# Patient Record
Sex: Female | Born: 1970 | Race: White | Hispanic: Yes | Marital: Married | Smoking: Never smoker
Health system: Southern US, Community
[De-identification: ages and names within clinical notes are randomized; demographics above are authoritative.]

---

## 1997-11-12 ENCOUNTER — Ambulatory Visit (HOSPITAL_COMMUNITY): Admission: RE | Admit: 1997-11-12 | Discharge: 1997-11-12 | Payer: Self-pay | Admitting: *Deleted

## 1998-03-29 ENCOUNTER — Inpatient Hospital Stay (HOSPITAL_COMMUNITY): Admission: AD | Admit: 1998-03-29 | Discharge: 1998-03-29 | Payer: Self-pay | Admitting: Obstetrics

## 1998-04-25 ENCOUNTER — Inpatient Hospital Stay (HOSPITAL_COMMUNITY): Admission: AD | Admit: 1998-04-25 | Discharge: 1998-04-27 | Payer: Self-pay | Admitting: Family Medicine

## 1998-04-25 ENCOUNTER — Inpatient Hospital Stay (HOSPITAL_COMMUNITY): Admission: AD | Admit: 1998-04-25 | Discharge: 1998-04-25 | Payer: Self-pay | Admitting: Obstetrics & Gynecology

## 1998-04-30 ENCOUNTER — Encounter (HOSPITAL_COMMUNITY): Admission: RE | Admit: 1998-04-30 | Discharge: 1998-07-29 | Payer: Self-pay | Admitting: Obstetrics & Gynecology

## 2002-04-10 ENCOUNTER — Encounter: Admission: RE | Admit: 2002-04-10 | Discharge: 2002-04-10 | Payer: Self-pay | Admitting: Family Medicine

## 2002-04-10 ENCOUNTER — Encounter: Payer: Self-pay | Admitting: Family Medicine

## 2005-10-03 ENCOUNTER — Inpatient Hospital Stay (HOSPITAL_COMMUNITY): Admission: AD | Admit: 2005-10-03 | Discharge: 2005-10-03 | Payer: Self-pay | Admitting: Obstetrics

## 2005-12-05 ENCOUNTER — Inpatient Hospital Stay (HOSPITAL_COMMUNITY): Admission: AD | Admit: 2005-12-05 | Discharge: 2005-12-07 | Payer: Self-pay | Admitting: Obstetrics

## 2006-06-02 ENCOUNTER — Emergency Department (HOSPITAL_COMMUNITY): Admission: EM | Admit: 2006-06-02 | Discharge: 2006-06-02 | Payer: Self-pay | Admitting: *Deleted

## 2010-07-11 ENCOUNTER — Inpatient Hospital Stay (HOSPITAL_COMMUNITY)
Admission: AD | Admit: 2010-07-11 | Discharge: 2010-07-11 | Disposition: A | Discharge status: Home / Self Care | Payer: Self-pay | Source: Ambulatory Visit | Attending: Obstetrics & Gynecology | Admitting: Obstetrics & Gynecology

## 2010-07-11 ENCOUNTER — Inpatient Hospital Stay (HOSPITAL_COMMUNITY): Payer: Self-pay

## 2010-07-11 DIAGNOSIS — R109 Unspecified abdominal pain: Secondary | ICD-10-CM

## 2010-07-11 DIAGNOSIS — O209 Hemorrhage in early pregnancy, unspecified: Secondary | ICD-10-CM | POA: Insufficient documentation

## 2010-07-11 LAB — URINALYSIS, ROUTINE W REFLEX MICROSCOPIC
Glucose, UA: NEGATIVE mg/dL
Leukocytes, UA: NEGATIVE
pH: 6.5 (ref 5.0–8.0)

## 2010-07-11 LAB — ABO/RH: ABO/RH(D): O POS

## 2010-07-11 LAB — CBC
MCH: 25 pg — ABNORMAL LOW (ref 26.0–34.0)
MCHC: 32.1 g/dL (ref 30.0–36.0)
Platelets: 283 10*3/uL (ref 150–400)
RDW: 16.7 % — ABNORMAL HIGH (ref 11.5–15.5)

## 2010-07-11 LAB — URINE MICROSCOPIC-ADD ON

## 2010-07-13 ENCOUNTER — Inpatient Hospital Stay (HOSPITAL_COMMUNITY)
Admission: AD | Admit: 2010-07-13 | Discharge: 2010-07-13 | Disposition: A | Discharge status: Home / Self Care | Payer: Self-pay | Source: Ambulatory Visit | Attending: Obstetrics & Gynecology | Admitting: Obstetrics & Gynecology

## 2010-07-13 DIAGNOSIS — O209 Hemorrhage in early pregnancy, unspecified: Secondary | ICD-10-CM | POA: Insufficient documentation

## 2010-07-13 LAB — HCG, QUANTITATIVE, PREGNANCY: hCG, Beta Chain, Quant, S: 7580 m[IU]/mL — ABNORMAL HIGH (ref <5)

## 2010-07-20 ENCOUNTER — Inpatient Hospital Stay (HOSPITAL_COMMUNITY): Admission: AD | Admit: 2010-07-20 | Payer: Self-pay | Source: Ambulatory Visit | Admitting: Obstetrics and Gynecology

## 2010-07-20 ENCOUNTER — Inpatient Hospital Stay (HOSPITAL_COMMUNITY)
Admission: AD | Admit: 2010-07-20 | Discharge: 2010-07-20 | Disposition: A | Discharge status: Home / Self Care | Payer: Self-pay | Source: Ambulatory Visit | Attending: Obstetrics and Gynecology | Admitting: Obstetrics and Gynecology

## 2010-07-20 ENCOUNTER — Encounter (HOSPITAL_COMMUNITY): Payer: Self-pay

## 2010-07-20 DIAGNOSIS — O039 Complete or unspecified spontaneous abortion without complication: Secondary | ICD-10-CM

## 2010-07-20 NOTE — Progress Notes (Deleted)
Pt presented to lobby c/o severe abd pain and cramping since this am. She gets prenatal care in [facility]. She states she has been in the hospital for preterm labor during this pregnancy. She is a G2P1 at [employer].  Cervix is completely dilated with bulging membranes.  Faculty practice notified and pt on her way to L&D.

## 2010-07-20 NOTE — Progress Notes (Signed)
Dr. Dimple Casey with pt in triage room discussing lab results. Pt voices understanding per Eda, Spanish interpreter.

## 2010-07-20 NOTE — Progress Notes (Signed)
To MAU for repeat BHCG

## 2010-07-20 NOTE — Progress Notes (Signed)
07/20/2010 Nickolaus Bordelon  Interpreter  I assisted Huntley Dec RN and Henrietta Hoover PA with plan of care.

## 2010-07-20 NOTE — ED Provider Notes (Signed)
History   Pt is a G3P2AB and presents today for repeat B-quant. She has a documented failed pregnancy and has been followed with expectant management. Her BHCG was 28,415 on 07/11/10 and 7,580 on 07/13/10. Pt states she is doing well and has no complaints at this time. She denies abd pain and reports only minimal vag spotting.  Chief Complaint  Patient presents with  . Follow-up   The history is provided by the patient. The history is limited by a language barrier. A language interpreter was used.      No past medical history on file.  No past surgical history on file.  No family history on file.  History  Substance Use Topics  . Smoking status: Not on file  . Smokeless tobacco: Not on file  . Alcohol Use: Not on file    Allergies: Allergies not on file   (Not in a hospital admission)  Review of Systems  Constitutional: Negative.   Gastrointestinal: Negative.   Genitourinary: Negative.   Neurological: Negative.   Psychiatric/Behavioral: Negative.    Physical Exam   Blood pressure 106/68, pulse 60, temperature 98.6 F (37 C), temperature source Oral, resp. rate 16.  Physical Exam  Constitutional: She is oriented to person, place, and time. She appears well-developed and well-nourished.  GI: She exhibits no distension. There is no tenderness. There is no rebound and no guarding.  Neurological: She is alert and oriented to person, place, and time.  Skin: Skin is warm and dry.  Psychiatric: She has a normal mood and affect. Her behavior is normal. Judgment and thought content normal.    MAU Course  Procedures  BHCG today is 486.  A/P: 1) Complete AB: discussed with pt at length via the interpreter. The pt is having no pain and no heavy bleeding. She will f/u in the GYN clinic. The GYN clinic will contact her for a f/u appt. Discussed diet, activity, risks, and precautions.   Henrietta Hoover, [place] 07/20/10 1013

## 2010-08-04 ENCOUNTER — Ambulatory Visit: Payer: Self-pay | Admitting: Obstetrics and Gynecology

## 2013-11-10 ENCOUNTER — Encounter (HOSPITAL_COMMUNITY): Payer: Self-pay

## 2016-07-01 ENCOUNTER — Ambulatory Visit (HOSPITAL_COMMUNITY)
Admission: EM | Admit: 2016-07-01 | Discharge: 2016-07-01 | Disposition: A | Discharge status: Home / Self Care | Payer: Self-pay | Attending: Nurse Practitioner | Admitting: Nurse Practitioner

## 2016-07-01 ENCOUNTER — Encounter (HOSPITAL_COMMUNITY): Payer: Self-pay | Admitting: *Deleted

## 2016-07-01 DIAGNOSIS — Z3202 Encounter for pregnancy test, result negative: Secondary | ICD-10-CM

## 2016-07-01 DIAGNOSIS — N3001 Acute cystitis with hematuria: Secondary | ICD-10-CM | POA: Insufficient documentation

## 2016-07-01 LAB — POCT PREGNANCY, URINE: Preg Test, Ur: NEGATIVE

## 2016-07-01 MED ORDER — NITROFURANTOIN MONOHYD MACRO 100 MG PO CAPS: 100.0000 mg | ORAL_CAPSULE | Freq: Two times a day (BID) | ORAL | 0 refills | Status: AC

## 2016-07-01 NOTE — ED Provider Notes (Signed)
CSN: [id number]     Arrival date & time 07/01/16  1626 History   First MD Initiated Contact with Patient 07/01/16 1734     Chief Complaint  Patient presents with  . Urinary Tract Infection   (Consider location/radiation/quality/duration/timing/severity/associated sxs/prior Treatment) Patient is a 46 y.o. Female, with hx of UTI, presents today with concern for UTI. She reports 2-day duration of dysuria accompany by lower abdominal pain, some back pain, malodorous urine and presence of blood in her urine. She denies fever or nausea. She also denies abnormal vaginal discharge.         History reviewed. No pertinent past medical history. History reviewed. No pertinent surgical history. History reviewed. No pertinent family history. Social History  Substance Use Topics  . Smoking status: Never Smoker  . Smokeless tobacco: Never Used  . Alcohol use No   OB History    No data available     Review of Systems  Constitutional:       See HPI    Allergies  Patient has no known allergies.  Home Medications   Prior to Admission medications   Not on File   Meds Ordered and Administered this Visit  Medications - No data to display  BP 126/69 (BP Location: Left Arm)   Pulse 77   Temp 99.7 F (37.6 C) (Oral)   Resp 16   LMP 06/17/2016   SpO2 97%  No data found.   Physical Exam  Constitutional: She is oriented to person, place, and time. She appears well-developed and well-nourished.  HENT:  Head: Normocephalic and atraumatic.  Cardiovascular: Normal rate and regular rhythm.   Murmur heard. Grade 2 systolic murmur present most prominent at the pulmonic area.   Pulmonary/Chest: Effort normal and breath sounds normal.  Abdominal: Soft. Bowel sounds are normal.  Suprapubic tenderness present  Genitourinary:  Genitourinary Comments: Mild Right CVA tenderness present  Neurological: She is alert and oriented to person, place, and time.  Skin: Skin is warm and dry.  Nursing  note reviewed.   Urgent Care Course     Procedures (including critical care time)  Labs Review Labs Reviewed  URINE CULTURE  POCT PREGNANCY, URINE    Imaging Review No results found.  MDM   1. Acute cystitis with hematuria    UA has large leukocytes, trace protein, large hemoglobin.   UA and patient's symptoms are indicatives of UTI. Urine culture pending. Send home with Macrobid BID x 5 days.  F/u with PCP for no improvement.    Barry Dienes, NP 07/01/16 1742

## 2016-07-01 NOTE — ED Triage Notes (Signed)
Pt  Reports  Low  abd  Pain   woyh  Burning  On  Urination  Which  Started   2   Days  Ago      Frequent  Urination    Has  Had   uti  In past

## 2016-07-04 LAB — URINE CULTURE: Culture: >=100000 — AB

## 2017-04-24 ENCOUNTER — Ambulatory Visit (HOSPITAL_COMMUNITY)
Admission: EM | Admit: 2017-04-24 | Discharge: 2017-04-24 | Disposition: A | Discharge status: Home / Self Care | Payer: Self-pay | Attending: Family Medicine | Admitting: Family Medicine

## 2017-04-24 ENCOUNTER — Encounter (HOSPITAL_COMMUNITY): Payer: Self-pay | Admitting: Emergency Medicine

## 2017-04-24 DIAGNOSIS — K645 Perianal venous thrombosis: Secondary | ICD-10-CM

## 2017-04-24 MED ORDER — HYDROCORTISONE 2.5 % RE CREA: 1.0000 "application " | TOPICAL_CREAM | Freq: Two times a day (BID) | RECTAL | 0 refills | Status: DC

## 2017-04-24 MED ORDER — LIDOCAINE-EPINEPHRINE (PF) 2 %-1:200000 IJ SOLN
INTRAMUSCULAR | Status: AC
  Filled 2017-04-24: qty 20

## 2017-04-24 NOTE — ED Provider Notes (Signed)
[place]    CSN: [id number] Arrival date & time: 04/24/17  1741     History   Chief Complaint Chief Complaint  Patient presents with  . Hemorrhoids    HPI Linda Decker is a 47 y.o. female.   Complains of hemorrhoidal pain.  There is no bleeding.  She has had pain for several days.  There is a lot of swelling per history.  HPI  History reviewed. No pertinent past medical history.  There are no active problems to display for this patient.   History reviewed. No pertinent surgical history.  OB History   None      Home Medications    Prior to Admission medications   Not on File    Family History History reviewed. No pertinent family history.  Social History Social History   Tobacco Use  . Smoking status: Never Smoker  . Smokeless tobacco: Never Used  Substance Use Topics  . Alcohol use: Never    Frequency: Never  . Drug use: Never     Allergies   Patient has no known allergies.   Review of Systems Review of Systems  Constitutional: Negative for chills and fever.  HENT: Negative for ear pain and sore throat.   Eyes: Negative for pain and visual disturbance.  Respiratory: Negative for cough and shortness of breath.   Cardiovascular: Negative for chest pain and palpitations.  Gastrointestinal: Positive for rectal pain. Negative for abdominal pain and vomiting.  Genitourinary: Negative for dysuria and hematuria.  Musculoskeletal: Negative for arthralgias and back pain.  Skin: Negative for color change and rash.  Neurological: Negative for seizures and syncope.  All other systems reviewed and are negative.    Physical Exam Triage Vital Signs ED Triage Vitals [04/24/17 1758]  Enc Vitals Group     BP (!) 146/80     Pulse Rate 80     Resp 18     Temp 98.9 F (37.2 C)     Temp Source Oral     SpO2 97 %     Weight      Height      Head Circumference      Peak Flow      Pain Score      Pain Loc      Pain Edu?      Excl. in [place]?    No data found.  Updated Vital Signs BP (!) 146/80 (BP Location: Left Arm)   Pulse 80   Temp 98.9 F (37.2 C) (Oral)   Resp 18   SpO2 97%   Visual Acuity Right Eye Distance:   Left Eye Distance:   Bilateral Distance:    Right Eye Near:   Left Eye Near:    Bilateral Near:     Physical Exam  Constitutional: She appears well-developed and well-nourished.  Abdominal:  External hemorrhoid with large amount of swelling appears to be thrombosed but when I am in the minimal clot was evacuated.  Suspect what clot was there has already dissolved     UC Treatments / Results  Labs (all labs ordered are listed, but only abnormal results are displayed) Labs Reviewed - No data to display  EKG None Radiology No results found.  Procedures Procedures (including critical care time)  Medications Ordered in UC Medications - No data to display   Initial Impression / Assessment and Plan / UC Course  I have reviewed the triage vital signs and the nursing notes.  Pertinent labs &  imaging results that were available during my care of the patient were reviewed by me and considered in my medical decision making (see chart for details).     Thrombosed hemorrhoid I&D'ed  Final Clinical Impressions(s) / UC Diagnoses   Final diagnoses:  None    ED Discharge Orders    None       Controlled Substance Prescriptions [place] Controlled Substance Registry consulted? No   Wardell Honour, MD 04/24/17 [phone]

## 2017-04-24 NOTE — ED Triage Notes (Signed)
Pt sts pain and swelling from possible hemorrhoids

## 2017-06-08 ENCOUNTER — Encounter (HOSPITAL_COMMUNITY): Payer: Self-pay

## 2019-04-09 ENCOUNTER — Ambulatory Visit (HOSPITAL_COMMUNITY)
Admission: EM | Admit: 2019-04-09 | Discharge: 2019-04-09 | Disposition: A | Discharge status: Home / Self Care | Payer: Self-pay

## 2019-04-09 ENCOUNTER — Other Ambulatory Visit: Payer: Self-pay

## 2019-04-09 ENCOUNTER — Encounter (HOSPITAL_COMMUNITY): Payer: Self-pay

## 2019-04-09 DIAGNOSIS — K625 Hemorrhage of anus and rectum: Secondary | ICD-10-CM

## 2019-04-09 LAB — POC OCCULT BLOOD, ED: Fecal Occult Bld: POSITIVE — AB

## 2019-04-09 LAB — OCCULT BLOOD, POC DEVICE: Fecal Occult Bld: POSITIVE — AB

## 2019-04-09 MED ORDER — HYDROCORTISONE ACETATE 25 MG RE SUPP: 25.0000 mg | Freq: Two times a day (BID) | RECTAL | 0 refills | Status: DC

## 2019-04-09 NOTE — ED Triage Notes (Signed)
Pt reports she saw blood lines in her stool yesterday afternoon and this morning. Pt reports having hemorrhoids.

## 2019-04-09 NOTE — Discharge Instructions (Addendum)
Nothing concerning today.  Use the rectal suppository 2 x day as needed  Follow up as needed for continued or worsening symptoms

## 2019-04-10 NOTE — ED Provider Notes (Signed)
[place]    CSN: [id number] Arrival date & time: 04/09/19  1316      History   Chief Complaint Chief Complaint  Patient presents with  . Blood In Stools    HPI Linda Decker is a 49 y.o. female.   Patient is a 49 year old female presents today for blood in stools.  This started yesterday afternoon.  She has had 2 episodes of this.  The bleeding has been bright red and with wiping.  History of hemorrhoids.  Denies any rectal pain.  Reporting she has been having regular bowel movements.  No abdominal pain, nausea or vomiting.  She has not done anything for the symptoms.  ROS per HPI      History reviewed. No pertinent past medical history.  There are no problems to display for this patient.   History reviewed. No pertinent surgical history.  OB History    Gravida  1   Para  0   Term  0   Preterm  0   AB  0   Living        SAB  0   TAB  0   Ectopic  0   Multiple      Live Births               Home Medications    Prior to Admission medications   Medication Sig Start Date End Date Taking? Authorizing Provider  Calcium Carbonate-Vitamin D (CALCIUM-D PO) Take by mouth.   Yes [provider]  Multiple Vitamin (MULTIVITAMIN) tablet Take 1 tablet by mouth daily.   Yes [provider]  hydrocortisone (ANUSOL-HC) 2.5 % rectal cream Place 1 application rectally 2 (two) times daily. 04/24/17   Wardell Honour, MD  hydrocortisone (ANUSOL-HC) 25 MG suppository Place 1 suppository (25 mg total) rectally 2 (two) times daily. 04/09/19   Orvan July, NP    Family History History reviewed. No pertinent family history.  Social History Social History   Tobacco Use  . Smoking status: Never Smoker  . Smokeless tobacco: Never Used  Substance Use Topics  . Alcohol use: Never  . Drug use: Never     Allergies   Patient has no known allergies.   Review of Systems Review of Systems   Physical Exam Triage Vital  Signs ED Triage Vitals  Enc Vitals Group     BP 04/09/19 1329 (!) 136/59     Pulse Rate 04/09/19 1329 (!) 59     Resp 04/09/19 1329 17     Temp 04/09/19 1329 98.2 F (36.8 C)     Temp Source 04/09/19 1329 Oral     SpO2 04/09/19 1329 97 %     Weight --      Height --      Head Circumference --      Peak Flow --      Pain Score 04/09/19 1327 0     Pain Loc --      Pain Edu? --      Excl. in [place]? --    No data found.  Updated Vital Signs BP (!) 136/59 (BP Location: Right Arm)   Pulse (!) 59   Temp 98.2 F (36.8 C) (Oral)   Resp 17   LMP 06/17/2016   SpO2 97%   Breastfeeding Unknown   Visual Acuity Right Eye Distance:   Left Eye Distance:   Bilateral Distance:    Right Eye Near:   Left Eye Near:  Bilateral Near:     Physical Exam Vitals and nursing note reviewed.  Constitutional:      General: She is not in acute distress.    Appearance: Normal appearance. She is not ill-appearing, toxic-appearing or diaphoretic.  HENT:     Head: Normocephalic.     Nose: Nose normal.  Eyes:     Conjunctiva/sclera: Conjunctivae normal.  Pulmonary:     Effort: Pulmonary effort is normal.  Abdominal:     Palpations: Abdomen is soft.     Tenderness: There is no abdominal tenderness.  Genitourinary:    Comments: Skin tag from previous hemorrhoid.  No thrombosed hemorrhoids or active bleeding noticed in rectal area.  No rashes, lesions. Nontender.  Nothing palpated internally No obvious fissure Musculoskeletal:        General: Normal range of motion.     Cervical back: Normal range of motion.  Skin:    General: Skin is warm and dry.     Findings: No rash.  Neurological:     Mental Status: She is alert.  Psychiatric:        Mood and Affect: Mood normal.      UC Treatments / Results  Labs (all labs ordered are listed, but only abnormal results are displayed) Labs Reviewed  POC OCCULT BLOOD, ED - Abnormal; Notable for the following components:      Result Value    Fecal Occult Bld POSITIVE (*)    All other components within normal limits  OCCULT BLOOD, POC DEVICE - Abnormal; Notable for the following components:   Fecal Occult Bld POSITIVE (*)    All other components within normal limits    EKG   Radiology No results found.  Procedures Procedures (including critical care time)  Medications Ordered in UC Medications - No data to display  Initial Impression / Assessment and Plan / UC Course  I have reviewed the triage vital signs and the nursing notes.  Pertinent labs & imaging results that were available during my care of the patient were reviewed by me and considered in my medical decision making (see chart for details).     Rectal bleeding-most likely from small internal fissure or internal hemorrhoid. Nothing concerning on exam. She is not having any concerning signs or symptoms for GI bleed. Hemoccult was positive Will prescribe rectal suppositories to see if this helps. Follow up as needed for continued or worsening symptoms  Final Clinical Impressions(s) / UC Diagnoses   Final diagnoses:  Rectal bleeding     Discharge Instructions     Nothing concerning today.  Use the rectal suppository 2 x day as needed  Follow up as needed for continued or worsening symptoms     ED Prescriptions    Medication Sig Dispense Auth. Provider   hydrocortisone (ANUSOL-HC) 25 MG suppository Place 1 suppository (25 mg total) rectally 2 (two) times daily. 12 suppository Leman Martinek A, NP     PDMP not reviewed this encounter.   Loura Halt A, NP 04/10/19 [phone]

## 2019-06-03 ENCOUNTER — Other Ambulatory Visit: Payer: Self-pay

## 2019-06-03 ENCOUNTER — Ambulatory Visit (HOSPITAL_COMMUNITY)
Admission: EM | Admit: 2019-06-03 | Discharge: 2019-06-03 | Disposition: A | Discharge status: Home / Self Care | Payer: Self-pay | Attending: Emergency Medicine | Admitting: Emergency Medicine

## 2019-06-03 ENCOUNTER — Encounter (HOSPITAL_COMMUNITY): Payer: Self-pay

## 2019-06-03 ENCOUNTER — Ambulatory Visit (INDEPENDENT_AMBULATORY_CARE_PROVIDER_SITE_OTHER): Payer: Self-pay

## 2019-06-03 ENCOUNTER — Telehealth (HOSPITAL_COMMUNITY): Payer: Self-pay

## 2019-06-03 DIAGNOSIS — R0602 Shortness of breath: Secondary | ICD-10-CM

## 2019-06-03 DIAGNOSIS — Z3202 Encounter for pregnancy test, result negative: Secondary | ICD-10-CM

## 2019-06-03 LAB — POC URINE PREG, ED: Preg Test, Ur: NEGATIVE

## 2019-06-03 MED ORDER — HYDROXYZINE HCL 25 MG PO TABS: 25.0000 mg | ORAL_TABLET | Freq: Four times a day (QID) | ORAL | 0 refills | Status: DC | PRN

## 2019-06-03 NOTE — ED Provider Notes (Signed)
[place]    CSN: [id number] Arrival date & time: 06/03/19  1010      History   Chief Complaint Chief Complaint  Patient presents with  . Shortness of Breath  . Chills    HPI Linda Decker is a 49 y.o. female no significant past medical history presenting today for evaluation of shortness of breath.  Patient notes that over the past month she has had intermittent episodes of feeling short of breath at nighttime that will wake her from her sleep.  She has associated lightheadedness and feels the need for "air".  Symptoms will happen approximately 1-2 times per week.  Denies symptoms during the day.  She denies associated chest pain.  Initial episode was the worst and also felt some nausea, dry mouth as well as hand tingling.  This has not been persistent.  She reports allergy symptoms when symptoms began, but denies any persistent cough congestion or fevers.  She denies history of hypertension.  Does report snoring and some daytime sleepiness.  HPI  History reviewed. No pertinent past medical history.  There are no problems to display for this patient.   History reviewed. No pertinent surgical history.  OB History    Gravida  1   Para  0   Term  0   Preterm  0   AB  0   Living        SAB  0   TAB  0   Ectopic  0   Multiple      Live Births               Home Medications    Prior to Admission medications   Medication Sig Start Date End Date Taking? Authorizing Provider  Calcium Carbonate-Vitamin D (CALCIUM-D PO) Take by mouth.    [provider]  hydrOXYzine (ATARAX/VISTARIL) 25 MG tablet Take 1 tablet (25 mg total) by mouth every 6 (six) hours as needed for anxiety. 06/03/19   Wieters, Hallie C, PA-C  Multiple Vitamin (MULTIVITAMIN) tablet Take 1 tablet by mouth daily.    [provider]    Family History History reviewed. No pertinent family history.  Social History Social History   Tobacco Use  . Smoking  status: Never Smoker  . Smokeless tobacco: Never Used  Substance Use Topics  . Alcohol use: Never  . Drug use: Never     Allergies   Patient has no known allergies.   Review of Systems Review of Systems  Constitutional: Negative for activity change, appetite change, chills, fatigue and fever.  HENT: Negative for congestion, ear pain, rhinorrhea, sinus pressure, sore throat and trouble swallowing.   Eyes: Negative for discharge and redness.  Respiratory: Positive for shortness of breath. Negative for cough and chest tightness.   Cardiovascular: Negative for chest pain.  Gastrointestinal: Negative for abdominal pain, diarrhea, nausea and vomiting.  Musculoskeletal: Negative for myalgias.  Skin: Negative for rash.  Neurological: Positive for light-headedness. Negative for dizziness and headaches.     Physical Exam Triage Vital Signs ED Triage Vitals  Enc Vitals Group     BP 06/03/19 1039 (!) 147/77     Pulse Rate 06/03/19 1039 62     Resp 06/03/19 1039 18     Temp 06/03/19 1039 97.9 F (36.6 C)     Temp Source 06/03/19 1039 Oral     SpO2 06/03/19 1039 99 %     Weight --      Height --  Head Circumference --      Peak Flow --      Pain Score 06/03/19 1044 0     Pain Loc --      Pain Edu? --      Excl. in [place]? --    No data found.  Updated Vital Signs BP (!) 147/77 (BP Location: Right Arm)   Pulse 62   Temp 97.9 F (36.6 C) (Oral)   Resp 18   SpO2 99%   Visual Acuity Right Eye Distance:   Left Eye Distance:   Bilateral Distance:    Right Eye Near:   Left Eye Near:    Bilateral Near:     Physical Exam Vitals and nursing note reviewed.  Constitutional:      Appearance: She is well-developed.     Comments: No acute distress  HENT:     Head: Normocephalic and atraumatic.     Nose: Nose normal.  Eyes:     Conjunctiva/sclera: Conjunctivae normal.  Cardiovascular:     Rate and Rhythm: Normal rate.     Comments: No carotid bruits auscultated  Pulmonary:     Effort: Pulmonary effort is normal. No respiratory distress.     Comments: Breathing comfortably at rest, CTABL, no wheezing, rales or other adventitious sounds auscultated Abdominal:     General: There is no distension.  Musculoskeletal:        General: Normal range of motion.     Cervical back: Neck supple.  Skin:    General: Skin is warm and dry.  Neurological:     Mental Status: She is alert and oriented to person, place, and time.      UC Treatments / Results  Labs (all labs ordered are listed, but only abnormal results are displayed) Labs Reviewed  POC URINE PREG, ED    EKG   Radiology DG Chest 2 View  Result Date: 06/03/2019 CLINICAL DATA:  Shortness of breath for 1 month EXAM: CHEST - 2 VIEW COMPARISON:  None. FINDINGS: Cardiac shadow is enlarged. No focal infiltrate or sizable effusion is seen. No bony abnormality is noted. IMPRESSION: No active cardiopulmonary disease. Electronically Signed   By: Inez Catalina M.D.   On: 06/03/2019 11:57    Procedures Procedures (including critical care time)  Medications Ordered in UC Medications - No data to display  Initial Impression / Assessment and Plan / UC Course  I have reviewed the triage vital signs and the nursing notes.  Pertinent labs & imaging results that were available during my care of the patient were reviewed by me and considered in my medical decision making (see chart for details).     EKG normal sinus rhythm, no acute signs of ischemia or infarction, no abnormal beats. Chest x-ray unremarkable  Possible underlying sleep apnea contributing to nighttime awakenings versus stress/anxiety.  Providing hydroxyzine to use in the meantime.  Provided contact to follow-up with primary care for further evaluation of symptoms as well as to set up sleep study if needed.  Discussed strict return precautions. Patient verbalized understanding and is agreeable with plan.    Final Clinical  Impressions(s) / UC Diagnoses   Final diagnoses:  SOB (shortness of breath)     Discharge Instructions     Please try using hydroxyzine as needed for stress/anxiety symptoms Please follow up with primary care for further evaluation of symptoms if continuing as you may need a sleep study    ED Prescriptions    Medication Sig Dispense Auth. Provider  hydrOXYzine (ATARAX/VISTARIL) 25 MG tablet Take 1 tablet (25 mg total) by mouth every 6 (six) hours as needed for anxiety. 20 tablet Wieters, Custar C, PA-C     PDMP not reviewed this encounter.   Janith Lima, PA-C 06/03/19 1234

## 2019-06-03 NOTE — ED Triage Notes (Signed)
Pt reports intermittent shortness of breath at night x 1 month.  Pt states when she is having the SOB episodes she feels she feels lightheaded. Pt states her left arm feels heavy at this moment. Pt denies chest pain.

## 2019-06-03 NOTE — Discharge Instructions (Signed)
Please try using hydroxyzine as needed for stress/anxiety symptoms Please follow up with primary care for further evaluation of symptoms if continuing as you may need a sleep study

## 2019-07-03 ENCOUNTER — Other Ambulatory Visit: Payer: Self-pay

## 2019-07-03 ENCOUNTER — Emergency Department (HOSPITAL_COMMUNITY): Payer: Self-pay

## 2019-07-03 ENCOUNTER — Emergency Department (HOSPITAL_COMMUNITY)
Admission: EM | Admit: 2019-07-03 | Discharge: 2019-07-03 | Disposition: A | Discharge status: Home / Self Care | Payer: Self-pay | Attending: Emergency Medicine | Admitting: Emergency Medicine

## 2019-07-03 ENCOUNTER — Encounter (HOSPITAL_COMMUNITY): Payer: Self-pay | Admitting: Emergency Medicine

## 2019-07-03 DIAGNOSIS — Z79899 Other long term (current) drug therapy: Secondary | ICD-10-CM | POA: Insufficient documentation

## 2019-07-03 DIAGNOSIS — R0602 Shortness of breath: Secondary | ICD-10-CM | POA: Insufficient documentation

## 2019-07-03 LAB — CBC WITH DIFFERENTIAL/PLATELET
Abs Immature Granulocytes: 0.02 10*3/uL (ref 0.00–0.07)
Basophils Absolute: 0 10*3/uL (ref 0.0–0.1)
Basophils Relative: 1 %
Eosinophils Absolute: 0.1 10*3/uL (ref 0.0–0.5)
Eosinophils Relative: 2 %
HCT: 40 % (ref 36.0–46.0)
Hemoglobin: 12.9 g/dL (ref 12.0–15.0)
Immature Granulocytes: 0 %
Lymphocytes Relative: 49 %
Lymphs Abs: 3.2 10*3/uL (ref 0.7–4.0)
MCH: 28.5 pg (ref 26.0–34.0)
MCHC: 32.3 g/dL (ref 30.0–36.0)
MCV: 88.5 fL (ref 80.0–100.0)
Monocytes Absolute: 0.4 10*3/uL (ref 0.1–1.0)
Monocytes Relative: 6 %
Neutro Abs: 2.6 10*3/uL (ref 1.7–7.7)
Neutrophils Relative %: 42 %
Platelets: 289 10*3/uL (ref 150–400)
RBC: 4.52 MIL/uL (ref 3.87–5.11)
RDW: 14 % (ref 11.5–15.5)
WBC: 6.3 10*3/uL (ref 4.0–10.5)
nRBC: 0 % (ref 0.0–0.2)

## 2019-07-03 LAB — BASIC METABOLIC PANEL
Anion gap: 10 (ref 5–15)
BUN: 14 mg/dL (ref 6–20)
CO2: 24 mmol/L (ref 22–32)
Calcium: 9.2 mg/dL (ref 8.9–10.3)
Chloride: 104 mmol/L (ref 98–111)
Creatinine, Ser: 0.55 mg/dL (ref 0.44–1.00)
GFR calc Af Amer: >60 mL/min (ref >60)
GFR calc non Af Amer: >60 mL/min (ref >60)
Glucose, Bld: 115 mg/dL — ABNORMAL HIGH (ref 70–99)
Potassium: 3.5 mmol/L (ref 3.5–5.1)
Sodium: 138 mmol/L (ref 135–145)

## 2019-07-03 LAB — TROPONIN I (HIGH SENSITIVITY): Troponin I (High Sensitivity): <2 ng/L (ref <18)

## 2019-07-03 LAB — PREGNANCY, URINE: Preg Test, Ur: NEGATIVE

## 2019-07-03 NOTE — Discharge Instructions (Addendum)
You have been worked up for shortness of breath.  Labs were reassuring, x-ray did not show abnormalities.    I have given you information for [facility] please call them to schedule a appointment with him for further evaluation management as I believe you need further follow-up.  I want to come back to the emergency department if you develop severe shortness of breath, chest pain, nausea, vomiting, numbness or tingling in your legs as the symptoms require further evaluation management.

## 2019-07-03 NOTE — ED Provider Notes (Signed)
[facility] EMERGENCY DEPARTMENT Provider Note   CSN: [id number] Arrival date & time: 07/03/19  1751     History Chief Complaint  Patient presents with  . Lightheaded/Dizzy    Linda Decker is a 49 y.o. female.  HPI  Patient speaks Spanish and a interpreter was used.   Patient presents to emergency department with chief complaint of shortness of breath that started this morning.  Patient states while she was lying in bed she felt short of breath and was unable to catch her breath.  She states it lasted for about 2 hours and slowly went away.  She states that this has happened in the past and had work-up which not show any acute abnormalities possible sleep apnea or panic attacks.  She denies any sort of alleviating or aggravating factors.  She denies chest pain, nausea, vomiting, becoming  very sweaty.  Patient has no significant medical history, she denies smoking, drinking alcohol, doing drugs.  She denies leg swelling, leg pain, prolonged immobility, recent traumas or surgeries.  Patient denies headache, fever, chills, cough, sore throat, chest pain, nausea, vomiting, difficulty urinating, back pain, increased swelling  History reviewed. No pertinent past medical history.  There are no problems to display for this patient.   History reviewed. No pertinent surgical history.   OB History    Gravida  1   Para  0   Term  0   Preterm  0   AB  0   Living        SAB  0   TAB  0   Ectopic  0   Multiple      Live Births              No family history on file.  Social History   Tobacco Use  . Smoking status: Never Smoker  . Smokeless tobacco: Never Used  Substance Use Topics  . Alcohol use: Never  . Drug use: Never    Home Medications Prior to Admission medications   Medication Sig Start Date End Date Taking? Authorizing Provider  Calcium Carbonate-Vitamin D (CALCIUM-D PO) Take by mouth.    [provider]  hydrOXYzine  (ATARAX/VISTARIL) 25 MG tablet Take 1 tablet (25 mg total) by mouth every 6 (six) hours as needed for anxiety. 06/03/19   Wieters, Hallie C, PA-C  Multiple Vitamin (MULTIVITAMIN) tablet Take 1 tablet by mouth daily.    [provider]    Allergies    Patient has no known allergies.  Review of Systems   Review of Systems  Constitutional: Negative for chills and fever.  HENT: Negative for congestion and sore throat.   Eyes: Negative for pain and redness.  Respiratory: Positive for shortness of breath. Negative for cough.   Cardiovascular: Negative for chest pain and leg swelling.  Gastrointestinal: Negative for abdominal pain, diarrhea, nausea and vomiting.  Genitourinary: Negative for dysuria, enuresis and flank pain.  Musculoskeletal: Negative for back pain and joint swelling.  Skin: Negative for rash.  Neurological: Negative for dizziness, light-headedness and headaches.  Hematological: Does not bruise/bleed easily.    Physical Exam Updated Vital Signs BP (!) 149/74 (BP Location: Left Arm)   Pulse 63   Temp 97.8 F (36.6 C) (Oral)   Resp 18   Ht 5' (1.524 m)   Wt 65 kg   SpO2 99%   BMI 27.99 kg/m   Physical Exam Vitals and nursing note reviewed.  Constitutional:      General:  She is not in acute distress.    Appearance: She is not ill-appearing.  HENT:     Head: Normocephalic and atraumatic.     Nose: No congestion.     Mouth/Throat:     Mouth: Mucous membranes are moist.     Pharynx: Oropharynx is clear.  Eyes:     General: No scleral icterus.    Extraocular Movements: Extraocular movements intact.     Pupils: Pupils are equal, round, and reactive to light.  Cardiovascular:     Rate and Rhythm: Normal rate and regular rhythm.     Pulses: Normal pulses.     Heart sounds: No murmur heard.  No friction rub. No gallop.   Pulmonary:     Effort: No respiratory distress.     Breath sounds: No wheezing, rhonchi or rales.  Abdominal:     General: There is  no distension.     Palpations: Abdomen is soft.     Tenderness: There is no abdominal tenderness. There is no guarding.  Musculoskeletal:        General: No swelling or tenderness.     Right lower leg: No edema.     Left lower leg: No edema.  Skin:    General: Skin is warm and dry.     Capillary Refill: Capillary refill takes less than 2 seconds.     Findings: No rash.  Neurological:     Mental Status: She is alert and oriented to person, place, and time.  Psychiatric:        Mood and Affect: Mood normal.     ED Results / Procedures / Treatments   Labs (all labs ordered are listed, but only abnormal results are displayed) Labs Reviewed  BASIC METABOLIC PANEL - Abnormal; Notable for the following components:      Result Value   Glucose, Bld 115 (*)    All other components within normal limits  CBC WITH DIFFERENTIAL/PLATELET  PREGNANCY, URINE  TROPONIN I (HIGH SENSITIVITY)  TROPONIN I (HIGH SENSITIVITY)    EKG None  Radiology DG Chest 2 View  Result Date: 07/03/2019 CLINICAL DATA:  Shortness of breath and dizziness. EXAM: CHEST - 2 VIEW COMPARISON:  06/03/2019 FINDINGS: Persistent cardiomegaly. Pulmonary vascularity is normal. Lungs are clear. No effusions. No bone abnormality. IMPRESSION: No acute cardiopulmonary disease. Persistent cardiomegaly. Electronically Signed   By: Lorriane Shire M.D.   On: 07/03/2019 12:33    Procedures Procedures (including critical care time)  Medications Ordered in ED Medications - No data to display  ED Course  I have reviewed the triage vital signs and the nursing notes.  Pertinent labs & imaging results that were available during my care of the patient were reviewed by me and considered in my medical decision making (see chart for details).    MDM Rules/Calculators/A&P                          I have personally reviewed all imaging, labs and have interpreted them.  Due to patient's presentation most concern for PE versus MI  versus factious causes.  Unlikely that patient is suffering from a cardiac event  as her EKG was sinus rhythm without surrounding signs of ischemia, troponin was less than 2, patient denies chest pain.  Unlikely patient is suffering from a PE as she was nontachypneic, nontachycardic, afebrile, physical exam showed nonerythematous legs, no leg pain noted, she has very low risk factors for PE, (does not smoke,  no hormone use, no recent trauma or surgeries).  Unlikely patient suffering from infectious cause for her shortness of breath as her CBC did not show leukocytosis or anemia, x-ray of chest did not show any acute abnormalities, no infiltrates, no edema, no consolidations, no signs of pneumothorax or widened mediastinum.  Patient's BMP did not show any electrolyte abnormalities no signs of AKI.  Patient appears to be resting comfortably in bed, show no signs of acute distress.  Patient's vitals have remained stable, she does not meet criteria to be admitted to the hospital.  Likely patient's shortness of breath was result of an panic attack versus sleep apnea.  Recommend patient follow-up with primary care provider for further evaluation and management.  Patient was given at home instructions with strict return precautions.  Patient was discussed with attending who agrees with assessment and plan.  Patient was explained results and plan, she verbalized that she understood and agreed with said plan. Final Clinical Impression(s) / ED Diagnoses Final diagnoses:  SOB (shortness of breath)    Rx / DC Orders ED Discharge Orders    None       Marcello Fennel, PA-C 07/03/19 1748    Tegeler, Gwenyth Allegra, MD 07/03/19 2149

## 2019-07-03 NOTE — ED Triage Notes (Signed)
Patient reports dizziness/lightheaded this morning , alert and oriented/respirations unlabored , denies pain.

## 2019-08-06 ENCOUNTER — Ambulatory Visit (HOSPITAL_BASED_OUTPATIENT_CLINIC_OR_DEPARTMENT_OTHER): Payer: Self-pay | Admitting: Critical Care Medicine

## 2019-08-06 ENCOUNTER — Other Ambulatory Visit: Payer: Self-pay

## 2019-08-06 DIAGNOSIS — R0602 Shortness of breath: Secondary | ICD-10-CM

## 2019-08-06 NOTE — Progress Notes (Deleted)
   Subjective:    Patient ID: Linda Decker, female    DOB: 1970/07/26, 49 y.o.   MRN: [id number]  HPI    Review of Systems     Objective:   Physical Exam        Assessment & Plan:

## 2019-08-07 NOTE — Progress Notes (Signed)
Patient ID: Linda Decker, female   DOB: 06-02-1970, 49 y.o.   MRN: [id number] Pt canceled appt

## 2019-08-21 ENCOUNTER — Other Ambulatory Visit: Payer: Self-pay

## 2019-08-21 ENCOUNTER — Ambulatory Visit (HOSPITAL_COMMUNITY)
Admission: EM | Admit: 2019-08-21 | Discharge: 2019-08-21 | Disposition: A | Discharge status: Home / Self Care | Payer: Self-pay | Attending: Internal Medicine | Admitting: Internal Medicine

## 2019-08-21 ENCOUNTER — Encounter (HOSPITAL_COMMUNITY): Payer: Self-pay | Admitting: *Deleted

## 2019-08-21 DIAGNOSIS — N3001 Acute cystitis with hematuria: Secondary | ICD-10-CM | POA: Insufficient documentation

## 2019-08-21 DIAGNOSIS — R358 Other polyuria: Secondary | ICD-10-CM

## 2019-08-21 DIAGNOSIS — R300 Dysuria: Secondary | ICD-10-CM

## 2019-08-21 DIAGNOSIS — R319 Hematuria, unspecified: Secondary | ICD-10-CM

## 2019-08-21 LAB — POCT URINALYSIS DIPSTICK, ED / UC
Bilirubin Urine: NEGATIVE
Glucose, UA: NEGATIVE mg/dL
Ketones, ur: NEGATIVE mg/dL
Nitrite: NEGATIVE
Protein, ur: NEGATIVE mg/dL
Specific Gravity, Urine: 1.01 (ref 1.005–1.030)
Urobilinogen, UA: 0.2 mg/dL (ref 0.0–1.0)
pH: 6.5 (ref 5.0–8.0)

## 2019-08-21 MED ORDER — CEPHALEXIN 500 MG PO CAPS
500.0000 mg | ORAL_CAPSULE | Freq: Two times a day (BID) | ORAL | 0 refills | Status: AC
Start: 2019-08-21 — End: 2019-08-26

## 2019-08-21 NOTE — Discharge Instructions (Signed)
Increase oral fluid intake Take antibiotics as prescribed If symptoms worsen please return to urgent care to be re-evaluated

## 2019-08-21 NOTE — ED Triage Notes (Signed)
Patient reports hematuria, polyuria, and dysuria started this am. No abdominal pain. No fever.

## 2019-08-23 LAB — URINE CULTURE: Culture: >=100000 — AB

## 2019-08-23 NOTE — ED Provider Notes (Signed)
[facility]    CSN: [id number] Arrival date & time: 08/21/19  1705      History   Chief Complaint Chief Complaint  Patient presents with  . Polyuria  . Dysuria  . Hematuria    HPI Linda Decker is a 49 y.o. female comes to the urgent care with 1 day history of dysuria, frequency, urgency and some blood in urine.  She denies any flank pain.  No nausea or vomiting.  No fever or chills.  Patient denies any history of urinary tract infection.  No vaginal discharge.  HPI  History reviewed. No pertinent past medical history.  There are no problems to display for this patient.   History reviewed. No pertinent surgical history.  OB History    Gravida  1   Para  0   Term  0   Preterm  0   AB  0   Living        SAB  0   TAB  0   Ectopic  0   Multiple      Live Births               Home Medications    Prior to Admission medications   Medication Sig Start Date End Date Taking? Authorizing Provider  Calcium Carbonate-Vitamin D (CALCIUM-D PO) Take by mouth.    [provider]  cephALEXin (KEFLEX) 500 MG capsule Take 1 capsule (500 mg total) by mouth 2 (two) times daily for 5 days. 08/21/19 08/26/19  Chase Picket, MD  hydrOXYzine (ATARAX/VISTARIL) 25 MG tablet Take 1 tablet (25 mg total) by mouth every 6 (six) hours as needed for anxiety. 06/03/19   Wieters, Hallie C, PA-C  Multiple Vitamin (MULTIVITAMIN) tablet Take 1 tablet by mouth daily.    [provider]    Family History History reviewed. No pertinent family history.  Social History Social History   Tobacco Use  . Smoking status: Never Smoker  . Smokeless tobacco: Never Used  Substance Use Topics  . Alcohol use: Never  . Drug use: Never     Allergies   Patient has no known allergies.   Review of Systems Review of Systems  Constitutional: Negative.   Respiratory: Negative.   Musculoskeletal: Negative.      Physical Exam Triage Vital Signs ED  Triage Vitals [08/21/19 1852]  Enc Vitals Group     BP (!) 150/77     Pulse Rate 70     Resp 16     Temp 98.2 F (36.8 C)     Temp Source Oral     SpO2 98 %     Weight      Height      Head Circumference      Peak Flow      Pain Score 0     Pain Loc      Pain Edu?      Excl. in [place]?    No data found.  Updated Vital Signs BP (!) 150/77 (BP Location: Right Arm)   Pulse 70   Temp 98.2 F (36.8 C) (Oral)   Resp 16   SpO2 98%   Visual Acuity Right Eye Distance:   Left Eye Distance:   Bilateral Distance:    Right Eye Near:   Left Eye Near:    Bilateral Near:     Physical Exam Vitals and nursing note reviewed.  Constitutional:      General: She is not in acute distress.  Appearance: She is not ill-appearing.  Cardiovascular:     Rate and Rhythm: Normal rate and regular rhythm.     Pulses: Normal pulses.     Heart sounds: Normal heart sounds.  Abdominal:     General: Bowel sounds are normal.     Palpations: Abdomen is soft.  Musculoskeletal:        General: Normal range of motion.  Neurological:     Mental Status: She is alert.      UC Treatments / Results  Labs (all labs ordered are listed, but only abnormal results are displayed) Labs Reviewed  URINE CULTURE - Abnormal; Notable for the following components:      Result Value   Culture   (*)    Value: >=100,000 COLONIES/mL ESCHERICHIA COLI SUSCEPTIBILITIES TO FOLLOW Performed at [facility], [place] [address], [place], [place] [address]    All other components within normal limits  POCT URINALYSIS DIPSTICK, ED / UC - Abnormal; Notable for the following components:   Hgb urine dipstick LARGE (*)    Leukocytes,Ua LARGE (*)    All other components within normal limits    EKG   Radiology No results found.  Procedures Procedures (including critical care time)  Medications Ordered in UC Medications - No data to display  Initial Impression / Assessment and Plan / UC Course  I have  reviewed the triage vital signs and the nursing notes.  Pertinent labs & imaging results that were available during my care of the patient were reviewed by me and considered in my medical decision making (see chart for details).    1.  Acute cystitis with hematuria: Point-of-care urinalysis is positive for hematuria, leukocyte esterase and negative for nitrite Urine cultures have been sent Keflex 500 mg twice daily for 5 days If urine cultures require changing antibiotics-we will call the patient and update her antibiotics. Return precautions given Final Clinical Impressions(s) / UC Diagnoses   Final diagnoses:  Acute cystitis with hematuria     Discharge Instructions     Increase oral fluid intake Take antibiotics as prescribed If symptoms worsen please return to urgent care to be re-evaluated   ED Prescriptions    Medication Sig Dispense Auth. Provider   cephALEXin (KEFLEX) 500 MG capsule Take 1 capsule (500 mg total) by mouth 2 (two) times daily for 5 days. 10 capsule Christy Ehrsam, Myrene Galas, MD     PDMP not reviewed this encounter.   Chase Picket, MD 08/23/19 [phone]

## 2019-10-24 ENCOUNTER — Other Ambulatory Visit: Payer: Self-pay

## 2019-10-24 ENCOUNTER — Emergency Department (HOSPITAL_COMMUNITY): Payer: Self-pay

## 2019-10-24 ENCOUNTER — Observation Stay (HOSPITAL_COMMUNITY)
Admission: EM | Admit: 2019-10-24 | Discharge: 2019-10-25 | Disposition: A | Discharge status: Home / Self Care | Payer: Self-pay | Attending: Internal Medicine | Admitting: Internal Medicine

## 2019-10-24 ENCOUNTER — Encounter (HOSPITAL_COMMUNITY): Payer: Self-pay | Admitting: General Surgery

## 2019-10-24 DIAGNOSIS — N12 Tubulo-interstitial nephritis, not specified as acute or chronic: Secondary | ICD-10-CM | POA: Diagnosis present

## 2019-10-24 DIAGNOSIS — N10 Acute tubulo-interstitial nephritis: Principal | ICD-10-CM

## 2019-10-24 DIAGNOSIS — K389 Disease of appendix, unspecified: Secondary | ICD-10-CM

## 2019-10-24 DIAGNOSIS — K388 Other specified diseases of appendix: Secondary | ICD-10-CM | POA: Insufficient documentation

## 2019-10-24 DIAGNOSIS — Z20822 Contact with and (suspected) exposure to covid-19: Secondary | ICD-10-CM | POA: Insufficient documentation

## 2019-10-24 LAB — CBC WITH DIFFERENTIAL/PLATELET
Abs Immature Granulocytes: 0.01 10*3/uL (ref 0.00–0.07)
Basophils Absolute: 0 10*3/uL (ref 0.0–0.1)
Basophils Relative: 1 %
Eosinophils Absolute: 0 10*3/uL (ref 0.0–0.5)
Eosinophils Relative: 1 %
HCT: 38.8 % (ref 36.0–46.0)
Hemoglobin: 12.4 g/dL (ref 12.0–15.0)
Immature Granulocytes: 0 %
Lymphocytes Relative: 28 %
Lymphs Abs: 1.8 10*3/uL (ref 0.7–4.0)
MCH: 28.6 pg (ref 26.0–34.0)
MCHC: 32 g/dL (ref 30.0–36.0)
MCV: 89.4 fL (ref 80.0–100.0)
Monocytes Absolute: 0.4 10*3/uL (ref 0.1–1.0)
Monocytes Relative: 6 %
Neutro Abs: 4.2 10*3/uL (ref 1.7–7.7)
Neutrophils Relative %: 64 %
Platelets: 263 10*3/uL (ref 150–400)
RBC: 4.34 MIL/uL (ref 3.87–5.11)
RDW: 13.8 % (ref 11.5–15.5)
WBC: 6.6 10*3/uL (ref 4.0–10.5)
nRBC: 0 % (ref 0.0–0.2)

## 2019-10-24 LAB — URINALYSIS, ROUTINE W REFLEX MICROSCOPIC
Bilirubin Urine: NEGATIVE
Glucose, UA: NEGATIVE mg/dL
Ketones, ur: NEGATIVE mg/dL
Nitrite: NEGATIVE
Protein, ur: 100 mg/dL — AB
RBC / HPF: >50 RBC/hpf — ABNORMAL HIGH (ref 0–5)
Specific Gravity, Urine: 1.011 (ref 1.005–1.030)
WBC, UA: >50 WBC/hpf — ABNORMAL HIGH (ref 0–5)
pH: 7 (ref 5.0–8.0)

## 2019-10-24 LAB — BASIC METABOLIC PANEL
Anion gap: 10 (ref 5–15)
BUN: 11 mg/dL (ref 6–20)
CO2: 25 mmol/L (ref 22–32)
Calcium: 9.1 mg/dL (ref 8.9–10.3)
Chloride: 102 mmol/L (ref 98–111)
Creatinine, Ser: 0.49 mg/dL (ref 0.44–1.00)
GFR, Estimated: >60 mL/min (ref >60)
Glucose, Bld: 98 mg/dL (ref 70–99)
Potassium: 3.6 mmol/L (ref 3.5–5.1)
Sodium: 137 mmol/L (ref 135–145)

## 2019-10-24 LAB — PREGNANCY, URINE: Preg Test, Ur: NEGATIVE

## 2019-10-24 LAB — RESP PANEL BY RT PCR (RSV, FLU A&B, COVID)
Influenza A by PCR: NEGATIVE
Influenza B by PCR: NEGATIVE
Respiratory Syncytial Virus by PCR: NEGATIVE
SARS Coronavirus 2 by RT PCR: NEGATIVE

## 2019-10-24 LAB — HIV ANTIBODY (ROUTINE TESTING W REFLEX): HIV Screen 4th Generation wRfx: NONREACTIVE

## 2019-10-24 MED ORDER — ONDANSETRON HCL 4 MG/2ML IJ SOLN: 4.0000 mg | Freq: Four times a day (QID) | INTRAMUSCULAR | Status: DC | PRN

## 2019-10-24 MED ORDER — SODIUM CHLORIDE 0.9 % IV BOLUS
1000.0000 mL | Freq: Once | INTRAVENOUS | Status: AC
  Administered 2019-10-24: 1000 mL via INTRAVENOUS

## 2019-10-24 MED ORDER — SODIUM CHLORIDE 0.9 % IV SOLN
2.0000 g | INTRAVENOUS | Status: DC
  Administered 2019-10-25: 2 g via INTRAVENOUS
  Filled 2019-10-24: qty 20

## 2019-10-24 MED ORDER — ACETAMINOPHEN 500 MG PO TABS
1000.0000 mg | ORAL_TABLET | Freq: Once | ORAL | Status: AC
  Administered 2019-10-24: 1000 mg via ORAL
  Filled 2019-10-24: qty 2

## 2019-10-24 MED ORDER — SODIUM CHLORIDE 0.9 % IV SOLN
1.0000 g | INTRAVENOUS | Status: AC
  Administered 2019-10-24: 1 g via INTRAVENOUS
  Filled 2019-10-24: qty 10

## 2019-10-24 MED ORDER — ENOXAPARIN SODIUM 40 MG/0.4ML ~~LOC~~ SOLN
40.0000 mg | SUBCUTANEOUS | Status: DC
  Administered 2019-10-24: 40 mg via SUBCUTANEOUS
  Filled 2019-10-24: qty 0.4

## 2019-10-24 MED ORDER — ACETAMINOPHEN 650 MG RE SUPP: 650.0000 mg | Freq: Four times a day (QID) | RECTAL | Status: DC | PRN

## 2019-10-24 MED ORDER — SODIUM CHLORIDE 0.9 % IV SOLN
1.0000 g | Freq: Once | INTRAVENOUS | Status: AC
  Administered 2019-10-24: 1 g via INTRAVENOUS
  Filled 2019-10-24: qty 10

## 2019-10-24 MED ORDER — ACETAMINOPHEN 325 MG PO TABS: 650.0000 mg | ORAL_TABLET | Freq: Four times a day (QID) | ORAL | Status: DC | PRN

## 2019-10-24 MED ORDER — POLYETHYLENE GLYCOL 3350 17 G PO PACK: 17.0000 g | PACK | Freq: Every day | ORAL | Status: DC | PRN

## 2019-10-24 MED ORDER — ONDANSETRON HCL 4 MG PO TABS: 4.0000 mg | ORAL_TABLET | Freq: Four times a day (QID) | ORAL | Status: DC | PRN

## 2019-10-24 NOTE — Consult Note (Signed)
Linda Decker [birth year]  [id number].    Requesting MD: Dr. Gerlene Fee Chief Complaint/Reason for Consult: appendicitis  HPI:  This is a very pleasant 49 year old Hispanic female with no significant past medical history who began having left CVA, flank pain radiating around to her left inguinal area last night.  She began having chills.  She denies dysuria but admits to pruritus.  Because of worsening pain in her left flank she presented to the emergency department for evaluation.  She denies nausea or vomiting.  She has been eating well.  She denies diarrhea or constipation.  Upon arrival she was worked up for what was felt to likely be something urinary related.  She underwent a noncontrasted CT scan that reveals early pyelonephritis on the left side along with a urinalysis that is consistent with that.  She was also noted to have a mildly dilated appendix up to 11 mm with an appendicolith present.  She has no right-sided abdominal pain or symptoms consistent with appendicitis.  We have been asked to evaluate the patient for further recommendations.  ROS: ROS: Please see HPI, otherwise all other systems have been reviewed and are negative.  History reviewed. No pertinent family history.  History reviewed. No pertinent past medical history.  History reviewed. No pertinent surgical history.  Social History:  reports that she has never smoked. She has never used smokeless tobacco. She reports that she does not drink alcohol and does not use drugs.  Allergies: No Known Allergies  (Not in a hospital admission)    Physical Exam: Blood pressure 109/70, pulse (!) 58, temperature 98.9 F (37.2 C), temperature source Oral, resp. rate 18, height 5' (1.524 m), weight 62.6 kg, SpO2 98 %, unknown if currently breastfeeding. General: pleasant, WD, WN Hispanic female who is laying in bed in NAD HEENT: head is normocephalic, atraumatic.  Sclera are noninjected.  PERRL.  Ears and nose  without any masses or lesions.  Mouth is pink and moist Heart: regular, rate, and rhythm.  Normal s1,s2. No obvious murmurs, gallops, or rubs noted.  Palpable radial and pedal pulses bilaterally Lungs: CTAB, no wheezes, rhonchi, or rales noted.  Respiratory effort nonlabored Abd: soft, NT, ND, +BS, no masses, hernias, or organomegaly MS: all 4 extremities are symmetrical with no cyanosis, clubbing, or edema. Skin: warm and dry with no masses, lesions, or rashes Neuro: Cranial nerves 2-12 grossly intact, sensation is normal throughout Psych: A&Ox3 with an appropriate affect.   Results for orders placed or performed during the hospital encounter of 10/24/19 (from the past 48 hour(s))  Urinalysis, Routine w reflex microscopic Urine, Clean Catch     Status: Abnormal   Collection Time: 10/24/19  1:38 AM  Result Value Ref Range   Color, Urine YELLOW YELLOW   APPearance CLOUDY (A) CLEAR   Specific Gravity, Urine 1.011 1.005 - 1.030   pH 7.0 5.0 - 8.0   Glucose, UA NEGATIVE NEGATIVE mg/dL   Hgb urine dipstick LARGE (A) NEGATIVE   Bilirubin Urine NEGATIVE NEGATIVE   Ketones, ur NEGATIVE NEGATIVE mg/dL   Protein, ur 100 (A) NEGATIVE mg/dL   Nitrite NEGATIVE NEGATIVE   Leukocytes,Ua LARGE (A) NEGATIVE   RBC / HPF >50 (H) 0 - 5 RBC/hpf   WBC, UA >50 (H) 0 - 5 WBC/hpf   Bacteria, UA RARE (A) NONE SEEN    Comment: Performed at [facility], [address], [place], [place] [address]  Basic metabolic panel     Status:  None   Collection Time: 10/24/19  6:47 AM  Result Value Ref Range   Sodium 137 135 - 145 mmol/L   Potassium 3.6 3.5 - 5.1 mmol/L   Chloride 102 98 - 111 mmol/L   CO2 25 22 - 32 mmol/L   Glucose, Bld 98 70 - 99 mg/dL    Comment: Glucose reference range applies only to samples taken after fasting for at least 8 hours.   BUN 11 6 - 20 mg/dL   Creatinine, Ser 0.49 0.44 - 1.00 mg/dL   Calcium 9.1 8.9 - 10.3 mg/dL   GFR, Estimated >60 >60 mL/min   Anion gap 10 5 - 15     Comment: Performed at [facility] [address], [place], [place] [address]  CBC with Differential     Status: None   Collection Time: 10/24/19  6:47 AM  Result Value Ref Range   WBC 6.6 4.0 - 10.5 K/uL   RBC 4.34 3.87 - 5.11 MIL/uL   Hemoglobin 12.4 12.0 - 15.0 g/dL   HCT 38.8 36 - 46 %   MCV 89.4 80.0 - 100.0 fL   MCH 28.6 26.0 - 34.0 pg   MCHC 32.0 30.0 - 36.0 g/dL   RDW 13.8 11.5 - 15.5 %   Platelets 263 150 - 400 K/uL   nRBC 0.0 0.0 - 0.2 %   Neutrophils Relative % 64 %   Neutro Abs 4.2 1.7 - 7.7 K/uL   Lymphocytes Relative 28 %   Lymphs Abs 1.8 0.7 - 4.0 K/uL   Monocytes Relative 6 %   Monocytes Absolute 0.4 0.1 - 1.0 K/uL   Eosinophils Relative 1 %   Eosinophils Absolute 0.0 0.0 - 0.5 K/uL   Basophils Relative 1 %   Basophils Absolute 0.0 0.0 - 0.1 K/uL   Immature Granulocytes 0 %   Abs Immature Granulocytes 0.01 0.00 - 0.07 K/uL    Comment: Performed at [facility], [address], [place], [place] [address]   CT Renal Stone Study  Result Date: 10/24/2019 CLINICAL DATA:  Left flank pain EXAM: CT ABDOMEN AND PELVIS WITHOUT CONTRAST TECHNIQUE: Multidetector CT imaging of the abdomen and pelvis was performed following the standard protocol without oral or IV contrast. COMPARISON:  None. FINDINGS: Lower chest: Lung bases are clear. Hepatobiliary: No focal liver lesions are evident on this noncontrast enhanced study. The gallbladder wall is not appreciably thickened. There is no biliary duct dilatation. Pancreas: There is no pancreatic mass or inflammatory focus. Spleen: No splenic lesions are evident. Adrenals/Urinary Tract: Adrenals bilaterally appear normal. There is a 3 mm probable hyperdense cyst arising from the upper pole left kidney. No other evident renal mass. There is slight hydronephrosis on the left. There is no hydronephrosis on the right. There is no intrarenal calculus on either side. There is no appreciable ureteral calculus on either side. Urinary  bladder is midline with wall thickness within normal limits. Stomach/Bowel: There is no appreciable bowel wall or mesenteric thickening. There is moderate stool in the colon. There is no evident bowel obstruction. The terminal ileum appears normal. There is no free air or portal venous air. Vascular/Lymphatic: There is no abdominal aortic aneurysm. No vascular lesions are evident on this noncontrast enhanced study. No adenopathy by size criteria is evident in the abdomen pelvis. There are several subcentimeter lymph nodes in the right lower quadrant. Reproductive: Uterus is anteverted.  No evident adnexal mass. Other: The appendix measures 11 mm in thickness. There are  several small appendicoliths present. There is equivocal soft tissue stranding along the distal appendix. No periappendiceal fluid or abscess. No perforation. No abscess or ascites in the abdomen or pelvis. Slight fat in the umbilicus. Musculoskeletal: There is degenerative change in the lumbar spine with vacuum phenomenon at L5-S1. No blastic or lytic bone lesions are evident. No intramuscular lesions are evident. IMPRESSION: 1. The appendix is prominent measuring 11 mm in thickness. Subtle periappendiceal soft tissue stranding. Concern for early appendiceal inflammation. Advise appropriate clinical and laboratory assessment. Surgical consultation advisable given this appearance. Appendix: Location: Appendix arises from the cecum and extends medially and slightly superiorly with the appendix at the superior aspect of the iliac crest level on the right. Diameter: 11 mm Appendicolith:: 4 mm appendicular in the mid to distal appendix with smaller calcifications in the appendix nearby. Mucosal hyper-enhancement: Noncontrast study Extraluminal gas: None Periappendiceal collection: none.  Mild soft tissue stranding. 2. Slight hydronephrosis on the left without renal or ureteral calculus. Question recent calculus passage. Early pyelonephritis could present  in this manner. 3.  No bowel obstruction.  No abscess in the abdomen or pelvis. 4. Several lymph nodes in the right lower quadrant potentially may have inflammatory etiology given apparent appendiceal abnormality. Critical Value/emergent results were called by telephone at the time of interpretation on 10/24/2019 at 8:30 am to provider [facility] , who verbally acknowledged these results. Electronically Signed   By: Lowella Grip III M.D.   On: 10/24/2019 08:30      Assessment/Plan Left-sided pyelonephritis -defer to medical team on treatment of this  Questionable appendicitis on CT scan The patient does not have any symptoms consistent with appendicitis.  After review with Dr. Redmond Pulling and the patient CT scan it is felt like she has minimal thickening or findings concerning for appendicitis.  She does have an appendicolith present but that does not mean that she definitively has appendicitis.  She has absolutely no right-sided abdominal pain.  She has no fevers and no elevation of her white blood cell count.  I think it would be prudent to admit the patient overnight for observation to make sure she does not develop right-sided abdominal pain.  She may have antibiotics for her pyelonephritis but do not believe she needs antibiotics specifically for her CT findings.  She may have a regular diet.  If she is tolerating a diet and overall is improved tomorrow and continues to not have any abdominal pain she could likely be discharged home at that point.  If she does develop right-sided abdominal pain or worsens then we may need to reconsider a possible laparoscopic appendectomy.  This was discussed with the patient and she understands.   FEN - may have diet from our standpoint VTE - may have prophylaxis ID - per medical team for pyelonephritis   Henreitta Cea, [facility] 10/24/2019, 10:06 AM Please see Amion for pager number during day hours 7:00am-4:30pm or 7:00am -11:30am  on weekends

## 2019-10-24 NOTE — ED Triage Notes (Signed)
Pt has been having left flank pain that radiates into the left front area. Pt said there is urination frequency and hurts a little when she does urinate.

## 2019-10-24 NOTE — H&P (Addendum)
Date: 10/24/2019               Patient Name:  Linda Decker MRN: [id number]  DOB: 1970/02/16 Age / Sex: 49 y.o., female   PCP: Patient, No Pcp Per         Medical Service: Internal Medicine Teaching Service         Attending Physician: Dr. Lucious Groves, DO    First Contact: Dr. Agustin Cree  Pager: [phone]  Second Contact: Dr. Gilford Rile Pager: [phone]       After Hours (After 5p/  First Contact Pager: [phone]  weekends / holidays): Second Contact Pager: [phone]   Chief Complaint:Left flank pain and urinary frequency  History of Present Illness: 49 year old female with no significant past medical history, presented to ED for left flank pain and urinary frequency.  His symptoms are started about a week ago.  She mentions that 8 days ago she had some dysuria and urinary frequency. She took over-the-counter Azo and it helped somehow. However, yesterday she again experienced some urinary frequency and around 11:30 PM, she developed left flank pain.  Her flank pain was moderate in intensity, was constant and dull with radiation to her suprapubic area. No hematuria although the urine was a little bit darker after she took Azo last week. She also had some chills and came to ED around 1AM. She denies any fever but is not sure because she did not check her temperature.  She mentions that her flank pain significantly improved after coming here. She is currently feeling well and without symptoms. Review of system is negative for chest pain, shortness of breath, cough, nausea, vomiting, diarrhea, constipation, headache, dizziness. Given worsening of her urinary symptoms and left flank pain and chills, she presented to ED early morning today.  ED course: On arrival, she was afebrile, vital signs stable. No SIRS criteria. T: 98.9. She had mild CV angle tenderness at left side. WBC nl at 6.6,  UA showed, WBC>50, rare bacteria, protein 100, large hemoglobin and more than 50 RBC. CMP  unremarkable. CT renal ordered in ED that showed mild hydronephrosis suggestive of early pyelonephritis vs recently passed stone and also suspicious for early appendisitis. She received 1 g of IV ceftriaxone, 1 L of normal saline and 1 g of Tylenol. Gen surgery was consulted in ED, did not think this is appendicitis. IMTS was consulted for management of pyelonephritis.  Meds:  Current Meds  Medication Sig  . Calcium Carbonate-Vitamin D (CALCIUM-D PO) Take 1 tablet by mouth daily.   . Multiple Vitamin (MULTIVITAMIN) tablet Take 1 tablet by mouth daily.     Allergies: Allergies as of 10/24/2019  . (No Known Allergies)   History reviewed. No pertinent past medical history.  Family History:  Mother has HTN DM in father  Social History:  Patient lives with her husband and 2 daugthers in [place]. Denies smoking, or ilicit drug use. Drinks aldcohol socially.  Review of Systems: A complete ROS was negative except as per HPI.  Physical Exam:  Blood pressure 120/72, pulse (!) 58, temperature 98.9 F (37.2 C), temperature source Oral, resp. rate 18, height 5' (1.524 m), weight 62.6 kg, SpO2 99 %, unknown if currently breastfeeding. Physical Exam Constitutional:      General: She is not in acute distress.    Appearance: Normal appearance. She is not ill-appearing or toxic-appearing.  HENT:     Head: Normocephalic and atraumatic.     Mouth/Throat:  Mouth: Mucous membranes are moist.  Eyes:     Extraocular Movements: Extraocular movements intact.  Cardiovascular:     Rate and Rhythm: Normal rate and regular rhythm.     Pulses: Normal pulses.     Heart sounds: Normal heart sounds. No murmur heard.   Abdominal:     General: Bowel sounds are normal. There is no distension.     Palpations: Abdomen is soft.     Tenderness: There is no abdominal tenderness. There is left CVA tenderness. There is no guarding or rebound.  Musculoskeletal:     Right lower leg: No edema.     Left  lower leg: No edema.  Skin:    General: Skin is warm and dry.  Neurological:     General: No focal deficit present.     Mental Status: She is alert and oriented to person, place, and time.  Psychiatric:        Mood and Affect: Mood normal.        Behavior: Behavior normal.        Thought Content: Thought content normal.        Judgment: Judgment normal.     Labs:  CBC Latest Ref Rng & Units 10/24/2019 07/03/2019 07/11/2010  WBC 4.0 - 10.5 K/uL 6.6 6.3 6.0  Hemoglobin 12.0 - 15.0 g/dL 12.4 12.9 10.9(L)  Hematocrit 36 - 46 % 38.8 40.0 34.0(L)  Platelets 150 - 400 K/uL 263 289 283   CMP Latest Ref Rng & Units 10/24/2019 07/03/2019  Glucose 70 - 99 mg/dL 98 115(H)  BUN 6 - 20 mg/dL 11 14  Creatinine 0.44 - 1.00 mg/dL 0.49 0.55  Sodium 135 - 145 mmol/L 137 138  Potassium 3.5 - 5.1 mmol/L 3.6 3.5  Chloride 98 - 111 mmol/L 102 104  CO2 22 - 32 mmol/L 25 24  Calcium 8.9 - 10.3 mg/dL 9.1 9.2   Component     Latest Ref Rng & Units 10/24/2019  Color, Urine     YELLOW YELLOW  Appearance     CLEAR CLOUDY (A)  Specific Gravity, Urine     1.005 - 1.030 1.011  pH     5.0 - 8.0 7.0  Glucose, UA     NEGATIVE mg/dL NEGATIVE  Hgb urine dipstick     NEGATIVE LARGE (A)  Bilirubin Urine     NEGATIVE NEGATIVE  Ketones, ur     NEGATIVE mg/dL NEGATIVE  Protein     NEGATIVE mg/dL 100 (A)  Nitrite     NEGATIVE NEGATIVE  Leukocytes,Ua     NEGATIVE LARGE (A)  RBC / HPF     0 - 5 RBC/hpf >50 (H)  WBC, UA     0 - 5 WBC/hpf >50 (H)  Bacteria, UA     NONE SEEN RARE (A)  Urobilinogen, UA     0.0 - 1.0 mg/dL    EKG: Not obtained in ED   CXR: Not obtained in ED  CT renal:  IMPRESSION:  1. The appendix is prominent measuring 11 mm in thickness. Subtle  periappendiceal soft tissue stranding. Concern for early appendiceal  inflammation. Advise appropriate clinical and laboratory assessment.  Surgical consultation advisable given this appearance.   Appendix: Location: Appendix arises  from the cecum and extends  medially and slightly superiorly with the appendix at the superior  aspect of the iliac crest level on the right.   Diameter: 11 mm   Appendicolith: 4 mm appendicular in the mid to distal appendix with  smaller calcifications in the appendix nearby.   Mucosal hyper-enhancement: Noncontrast study   Extraluminal gas: None   Periappendiceal collection: none. Mild soft tissue stranding.   2. Slight hydronephrosis on the left without renal or ureteral  calculus. Question recent calculus passage. Early pyelonephritis  could present in this manner.   3. No bowel obstruction. No abscess in the abdomen or pelvis.   4. Several lymph nodes in the right lower quadrant potentially may  have inflammatory etiology given apparent appendiceal abnormality.   Critical Value/emergent results were called by telephone at the time  of interpretation on 10/24/2019 at 8:30 am to provider [facility] ,  who verbally acknowledged these results.    Assessment & Plan by Problem: Active Problems:   Pyelonephritis  Pyelonephritis: Patient had urinary symptoms: dysuria, frequency since last week that initially improved by Azo but reoccured last night and this time associated with left flank pain and chills.  No SIRS criteria on arrival. UA was positive for UTI, she had left CVA tenderness and CT renal showed slight hydronephrosis of left kidney without kidney stone or ureteral calculi.  That per radiologist, could represent early pyelonephritis or recently passed stone. Received 1gr Ceftriaxone, 1 li NS and Tylenol for pain in ED.  Her symptoms resolved.  Component     Latest Ref Rng & Units 10/24/2019  Color, Urine     YELLOW YELLOW  Appearance     CLEAR CLOUDY (A)  Specific Gravity, Urine     1.005 - 1.030 1.011  pH     5.0 - 8.0 7.0  Glucose, UA     NEGATIVE mg/dL NEGATIVE  Hgb urine dipstick     NEGATIVE LARGE (A)  Bilirubin Urine     NEGATIVE NEGATIVE   Ketones, ur     NEGATIVE mg/dL NEGATIVE  Protein     NEGATIVE mg/dL 100 (A)  Nitrite     NEGATIVE NEGATIVE  Leukocytes,Ua     NEGATIVE LARGE (A)  RBC / HPF     0 - 5 RBC/hpf >50 (H)  WBC, UA     0 - 5 WBC/hpf >50 (H)  Bacteria, UA     NONE SEEN RARE (A)  Urobilinogen, UA     0.0 - 1.0 mg/dL    No fever, no leukopenia or leukocytosis. No SIRS criteria. Mild left CVA tenderness. Kidney function is normal.  -S/P 1 li of NS -F/u UC -F/u COVID-19 result -Continue Ceftriaxone (10/15>>)  Enlarged appendix in CT scan concerning for developing appendicitis: No clinical or laboratory evidence of acute appendicitis: No abdominal pain and no right lower quadrant tenderness on exam. Patient is afebrile, no anorexia. No nausea or vomiting. No leukocytosis. (Alvarado score is zero).  -General surgery was consulted in ED,and thinks this is unlikely to be appendicitis (may be over read on CT) -If patient develops any abdominal pain or other concerning symptoms and right lower quadrant tenderness, will consider further evaluation and potential surgery. -OK to have diet per surgery -OK to be on VTE ppx per surgery  Consult for COVID-19 vaccination: Patient has not received COVID-19 vaccine. Denies Hx of allergy. She is interested in receiving it. -Consider consulting inpatient COVID vaccine team when acute infection resolves  Diet: Regular IV fluid: None VTE ppx: Lovenox Code status: Full  Dispo: Admit patient to Inpatient with expected length of stay greater than 2 midnights.  SignedDewayne Hatch, MD 10/24/2019, 1:07 PM  Pager: [phone] After 5pm on weekdays and 1pm on weekends: On  Call pager: [phone]

## 2019-10-24 NOTE — Hospital Course (Addendum)
Admitted 10/24/2019  Allergies: Patient has no known allergies. Pertinent Hx: No known PMHx  49 y.o. female p/w left flank pain, chills and urinary frequency   * Pyelonephritis: presented w left flank pain and urinary frequency and some dysuria and chills. No fever and nl WBC. UA w large leukocytes, RBC and rare bacteria. CT renal mild hydronephrosis and early pyelonephritis at [place].  *CT suspicious for early appendicitis: Asystematic. Gen surg was consulted. Was not concern about apendicitis. Nothing to do except symptomatic or RLQ tenderness.  Consults: gen surge Meds: Ceftriaxone VTE ppx: Lovenox IVF: None  Diet: Regular    *Linda Decker*  []  Follow-up urine culture []  Follow-up Covid test []  If develops signs or symptoms of appendicitis, call surgery  O/N events:

## 2019-10-24 NOTE — ED Notes (Addendum)
This RN attempted to call report x1 to 79M RN. Was told to wait 5 minutes. This RN provided work phone number to floor.

## 2019-10-24 NOTE — ED Notes (Signed)
Patient is resting comfortably. 

## 2019-10-24 NOTE — ED Notes (Addendum)
This RN attempted to call report to 66M RN x2.

## 2019-10-24 NOTE — ED Notes (Signed)
Patient denies pain and is resting comfortably.  

## 2019-10-24 NOTE — Progress Notes (Signed)
NEW ADMISSION NOTE New Admission Note:   Arrival Method: Wheelchair Mental Orientation: AAOx3 Telemetry: Box 14 Assessment: Completed Skin: Intact IV: 20G LFA Pain: 0/10 Tubes: n/a Safety Measures: Safety Fall Prevention Plan has been given, discussed and signed. Admission: Completed [facility] Orientation: Patient has been orientated to the room, unit and staff.  Family: none present  Orders have been reviewed and implemented. Will continue to monitor the patient. Call light has been placed within reach and bed alarm has been activated.   Vira Agar, RN

## 2019-10-24 NOTE — ED Provider Notes (Signed)
[facility]ERGENCY DEPARTMENT Provider Note   CSN: [id number] Arrival date & time: 10/24/19  0119     History Chief Complaint  Patient presents with  . Flank Pain    Linda Decker is a 49 y.o. female.  Linda Decker is a 49 y.o. female who is otherwise healthy, presents to the ED for evaluation of urinary frequency and left flank pain.  Patient states that 8 days ago she started having some dysuria and frequent urination.  She purchased Azo over-the-counter and started taking this and states that it helped with her urinary symptoms but then yesterday she started developing pain over her left flank and her urinary symptoms seem to return and worsen.  She has had chills but has not taken her temperature at home.  Denies nausea or vomiting.  States that sometimes the left flank pain radiates down into her left lower abdomen, but she has not had any continued abdominal pain.  She states that her urine has been discolored because of Azo but she has not noticed any obvious blood in her urine.  Reports history of some previous urinary tract infections with similar symptoms, is not sure if she has ever had a kidney infection, denies history of kidney stones.  No abnormal bowel movements.  No chest pain, shortness of breath or cough.  She has not taken any other medications to treat symptoms.  No other aggravating or alleviating factors.        No past medical history on file.  There are no problems to display for this patient.   No past surgical history on file.   OB History    Gravida  1   Para  0   Term  0   Preterm  0   AB  0   Living        SAB  0   TAB  0   Ectopic  0   Multiple      Live Births              No family history on file.  Social History   Tobacco Use  . Smoking status: Never Smoker  . Smokeless tobacco: Never Used  Substance Use Topics  . Alcohol use: Never  . Drug use: Never    Home Medications Prior to  Admission medications   Medication Sig Start Date End Date Taking? Authorizing Provider  Calcium Carbonate-Vitamin D (CALCIUM-D PO) Take by mouth.    [provider]  hydrOXYzine (ATARAX/VISTARIL) 25 MG tablet Take 1 tablet (25 mg total) by mouth every 6 (six) hours as needed for anxiety. 06/03/19   Wieters, Hallie C, PA-C  Multiple Vitamin (MULTIVITAMIN) tablet Take 1 tablet by mouth daily.    [provider]    Allergies    Patient has no known allergies.  Review of Systems   Review of Systems  Constitutional: Negative for chills and fever.  HENT: Negative.   Respiratory: Negative for cough and shortness of breath.   Cardiovascular: Negative for chest pain.  Gastrointestinal: Positive for abdominal pain. Negative for diarrhea, nausea and vomiting.  Genitourinary: Positive for dysuria, flank pain and frequency. Negative for hematuria, vaginal bleeding and vaginal discharge.  Musculoskeletal: Negative for arthralgias and myalgias.  Skin: Negative for color change and rash.  Neurological: Negative for dizziness, syncope and light-headedness.  All other systems reviewed and are negative.   Physical Exam Updated Vital Signs BP 140/78 (BP Location: Left Arm)   Pulse  80   Temp 98.9 F (37.2 C) (Oral)   Resp 18   Ht 5' (1.524 m)   Wt 62.6 kg   SpO2 97%   BMI 26.95 kg/m   Physical Exam Vitals and nursing note reviewed.  Constitutional:      General: She is not in acute distress.    Appearance: Normal appearance. She is well-developed. She is not ill-appearing or diaphoretic.     Comments: Well-appearing and in no distress  HENT:     Head: Normocephalic and atraumatic.     Mouth/Throat:     Mouth: Mucous membranes are moist.     Pharynx: Oropharynx is clear.  Eyes:     General:        Right eye: No discharge.        Left eye: No discharge.  Cardiovascular:     Rate and Rhythm: Normal rate and regular rhythm.     Heart sounds: Normal heart sounds. No  murmur heard.  No friction rub. No gallop.   Pulmonary:     Effort: Pulmonary effort is normal. No respiratory distress.     Breath sounds: Normal breath sounds. No wheezing or rales.     Comments: Respirations equal and unlabored, patient able to speak in full sentences, lungs clear to auscultation bilaterally Abdominal:     General: Bowel sounds are normal. There is no distension.     Palpations: Abdomen is soft. There is no mass.     Tenderness: There is no abdominal tenderness. There is left CVA tenderness. There is no guarding.     Comments: Abdomen soft, nondistended, nontender to palpation in all quadrants without guarding or peritoneal signs, patient does have some mild left CVA tenderness, none on the right  Musculoskeletal:        General: No deformity.     Cervical back: Neck supple.  Skin:    General: Skin is warm and dry.     Capillary Refill: Capillary refill takes less than 2 seconds.  Neurological:     Mental Status: She is alert and oriented to person, place, and time.     Coordination: Coordination normal.     Comments: Speech is clear, able to follow commands Moves extremities without ataxia, coordination intact  Psychiatric:        Mood and Affect: Mood normal.        Behavior: Behavior normal.     ED Results / Procedures / Treatments   Labs (all labs ordered are listed, but only abnormal results are displayed) Labs Reviewed  URINALYSIS, ROUTINE W REFLEX MICROSCOPIC - Abnormal; Notable for the following components:      Result Value   APPearance CLOUDY (*)    Hgb urine dipstick LARGE (*)    Protein, ur 100 (*)    Leukocytes,Ua LARGE (*)    RBC / HPF >50 (*)    WBC, UA >50 (*)    Bacteria, UA RARE (*)    All other components within normal limits  URINE CULTURE  RESP PANEL BY RT PCR (RSV, FLU A&B, COVID)  BASIC METABOLIC PANEL  CBC WITH DIFFERENTIAL/PLATELET    EKG None  Radiology CT Renal Stone Study  Result Date: 10/24/2019 CLINICAL DATA:   Left flank pain EXAM: CT ABDOMEN AND PELVIS WITHOUT CONTRAST TECHNIQUE: Multidetector CT imaging of the abdomen and pelvis was performed following the standard protocol without oral or IV contrast. COMPARISON:  None. FINDINGS: Lower chest: Lung bases are clear. Hepatobiliary: No focal liver lesions are  evident on this noncontrast enhanced study. The gallbladder wall is not appreciably thickened. There is no biliary duct dilatation. Pancreas: There is no pancreatic mass or inflammatory focus. Spleen: No splenic lesions are evident. Adrenals/Urinary Tract: Adrenals bilaterally appear normal. There is a 3 mm probable hyperdense cyst arising from the upper pole left kidney. No other evident renal mass. There is slight hydronephrosis on the left. There is no hydronephrosis on the right. There is no intrarenal calculus on either side. There is no appreciable ureteral calculus on either side. Urinary bladder is midline with wall thickness within normal limits. Stomach/Bowel: There is no appreciable bowel wall or mesenteric thickening. There is moderate stool in the colon. There is no evident bowel obstruction. The terminal ileum appears normal. There is no free air or portal venous air. Vascular/Lymphatic: There is no abdominal aortic aneurysm. No vascular lesions are evident on this noncontrast enhanced study. No adenopathy by size criteria is evident in the abdomen pelvis. There are several subcentimeter lymph nodes in the right lower quadrant. Reproductive: Uterus is anteverted.  No evident adnexal mass. Other: The appendix measures 11 mm in thickness. There are several small appendicoliths present. There is equivocal soft tissue stranding along the distal appendix. No periappendiceal fluid or abscess. No perforation. No abscess or ascites in the abdomen or pelvis. Slight fat in the umbilicus. Musculoskeletal: There is degenerative change in the lumbar spine with vacuum phenomenon at L5-S1. No blastic or lytic bone  lesions are evident. No intramuscular lesions are evident. IMPRESSION: 1. The appendix is prominent measuring 11 mm in thickness. Subtle periappendiceal soft tissue stranding. Concern for early appendiceal inflammation. Advise appropriate clinical and laboratory assessment. Surgical consultation advisable given this appearance. Appendix: Location: Appendix arises from the cecum and extends medially and slightly superiorly with the appendix at the superior aspect of the iliac crest level on the right. Diameter: 11 mm Appendicolith:: 4 mm appendicular in the mid to distal appendix with smaller calcifications in the appendix nearby. Mucosal hyper-enhancement: Noncontrast study Extraluminal gas: None Periappendiceal collection: none.  Mild soft tissue stranding. 2. Slight hydronephrosis on the left without renal or ureteral calculus. Question recent calculus passage. Early pyelonephritis could present in this manner. 3.  No bowel obstruction.  No abscess in the abdomen or pelvis. 4. Several lymph nodes in the right lower quadrant potentially may have inflammatory etiology given apparent appendiceal abnormality. Critical Value/emergent results were called by telephone at the time of interpretation on 10/24/2019 at 8:30 am to provider [facility] , who verbally acknowledged these results. Electronically Signed   By: Lowella Grip III M.D.   On: 10/24/2019 08:30    Procedures Procedures (including critical care time)  Medications Ordered in ED Medications  cefTRIAXone (ROCEPHIN) 1 g in sodium chloride 0.9 % 100 mL IVPB (has no administration in time range)  sodium chloride 0.9 % bolus 1,000 mL (1,000 mLs Intravenous New Bag/Given 10/24/19 0748)  acetaminophen (TYLENOL) tablet 1,000 mg (1,000 mg Oral Given 10/24/19 4401)    ED Course  I have reviewed the triage vital signs and the nursing notes.  Pertinent labs & imaging results that were available during my care of the patient were reviewed by me and  considered in my medical decision making (see chart for details).    MDM Rules/Calculators/A&P                         49 year old female presents for evaluation of dysuria, urinary frequency and flank pain,  urinary symptoms present for 8 days but flank pain beginning yesterday.  Subjective chills, afebrile with normal vitals.  She has not had any nausea vomiting and abdomen is nontender on exam aside from some mild left CVA tenderness.  High clinical suspicion for UTI, Pyelo, would also consider kidney stone or infected stone given more recent onset of left flank pain.  Will get labs, urinalysis, culture and CT renal stone study.   Urinalysis appears grossly infected with large leukocytes and greater than 50 RBCs and WBCs.  Urine culture pending, patient with no leukocytosis, normal hemoglobin, no acute electrolyte derangements and normal renal function.  Received call from Dr. Jasmine December with radiology regarding CT scan, patient has some mild hydronephrosis on the left without renal or ureteral calculus, could represent early pyelonephritis or recently passed stone, history favors [place].  But CT is also concerning for developing appendicitis, with enlarged appendix measuring 11 mm with a 4 mm appendicolith noted, recommended surgical consultation.  I went and reexamined the patient, she is feeling well, and does not have any right lower quadrant tenderness, given CT findings will discuss with general surgery.  Case discussed with PA Saverio Danker with general surgery who will see and evaluate the patient, likely favors antibiotic treatment and observation.  Surgery feels that this is unlikely appendicitis, but recommends medicine admission for continued treatment of pyelonephritis and observation, if patient develops right lower quadrant tenderness or symptoms more consistent with appendicitis then would consider further evaluation potential surgery.  They will follow along with patient.  Case  discussed with internal medicine teaching service who will see and admit the patient.   Final Clinical Impression(s) / ED Diagnoses Final diagnoses:  Pyelonephritis  Appendicolith    Rx / DC Orders ED Discharge Orders    None       Janet Berlin 10/24/19 1044    Ripley Fraise, MD 10/25/19 [phone]

## 2019-10-25 DIAGNOSIS — N12 Tubulo-interstitial nephritis, not specified as acute or chronic: Secondary | ICD-10-CM

## 2019-10-25 LAB — CBC
HCT: 38.3 % (ref 36.0–46.0)
Hemoglobin: 13.4 g/dL (ref 12.0–15.0)
MCH: 31.5 pg (ref 26.0–34.0)
MCHC: 35 g/dL (ref 30.0–36.0)
MCV: 89.9 fL (ref 80.0–100.0)
Platelets: 338 10*3/uL (ref 150–400)
RBC: 4.26 MIL/uL (ref 3.87–5.11)
RDW: 14.2 % (ref 11.5–15.5)
WBC: 4.2 10*3/uL (ref 4.0–10.5)
nRBC: 0 % (ref 0.0–0.2)

## 2019-10-25 LAB — COMPREHENSIVE METABOLIC PANEL
ALT: 19 U/L (ref 0–44)
AST: 21 U/L (ref 15–41)
Albumin: 3.5 g/dL (ref 3.5–5.0)
Alkaline Phosphatase: 61 U/L (ref 38–126)
Anion gap: 10 (ref 5–15)
BUN: 15 mg/dL (ref 6–20)
CO2: 23 mmol/L (ref 22–32)
Calcium: 9 mg/dL (ref 8.9–10.3)
Chloride: 108 mmol/L (ref 98–111)
Creatinine, Ser: 0.52 mg/dL (ref 0.44–1.00)
GFR, Estimated: >60 mL/min (ref >60)
Glucose, Bld: 96 mg/dL (ref 70–99)
Potassium: 4.2 mmol/L (ref 3.5–5.1)
Sodium: 141 mmol/L (ref 135–145)
Total Bilirubin: 0.8 mg/dL (ref 0.3–1.2)
Total Protein: 6.9 g/dL (ref 6.5–8.1)

## 2019-10-25 MED ORDER — CEFDINIR 300 MG PO CAPS: 300.0000 mg | ORAL_CAPSULE | Freq: Two times a day (BID) | ORAL | 0 refills | Status: DC

## 2019-10-25 MED ORDER — CEFDINIR 300 MG PO CAPS
300.0000 mg | ORAL_CAPSULE | Freq: Two times a day (BID) | ORAL | 0 refills | Status: AC
Start: 2019-10-26 — End: 2019-11-07

## 2019-10-25 NOTE — TOC Transition Note (Signed)
Transition of Care [facility]) - CM/SW Discharge Note   Patient Details  Name: TALLI KIMMER MRN: [id number] Date of Birth: Jul 18, 1970  Transition of Care [facility]) CM/SW Contact:  Bartholomew Crews, RN Phone Number: [phone] 10/25/2019, 3:13 PM   Clinical Narrative:     Spoke with patient at the bedside to discuss transition plans. Language Line telephonic Spanish interpreter utilized on speaker phone to assist with communication language barrier. Discussed using GoodRx coupon to minimize cost of the antibiotic.  Patient verbalized understanding and stated that she would be able to get the medication. Advised to call Internal Medicine clinic in order to schedule hospital follow up as indicated already on AVS. Patient states that she has transportation home. No further questions verbalized. No further TOC needs identified at this time.   Final next level of care: Home/Self Care Barriers to Discharge: No Barriers Identified   Patient Goals and CMS Choice Patient states their goals for this hospitalization and ongoing recovery are:: return home CMS Medicare.gov Compare Post Acute Care list provided to:: Patient Choice offered to / list presented to : NA  Discharge Placement                       Discharge Plan and Services                DME Arranged: N/A DME Agency: NA       HH Arranged: NA HH Agency: NA        Social Determinants of Health (SDOH) Interventions     Readmission Risk Interventions No flowsheet data found.

## 2019-10-25 NOTE — Progress Notes (Signed)
HD#1 Subjective:  Overnight Events: No complaints overnight   Patient states she is doing well. She denies any abdominal pain, back pain, pain with urination, fever, chills. She states she feels comfortable being discharged and taking oral antibiotics. It was explained in full to the patient what her findings were.   She stated she did not have a PCP and when asked if she would like to follow up at our clinic she said yes. She states she will follow up with the clinic next week.   She denied any other questions or concerns at this time. Spanish translator services were used.   Objective:  Vital signs in last 24 hours: Vitals:   10/24/19 1720 10/24/19 2125 10/25/19 0123 10/25/19 0529  BP: 121/68 111/62 115/77 124/81  Pulse: 70 (!) 59 (!) 57 64  Resp: 18 16 18 18   Temp: 98.2 F (36.8 C) 98.9 F (37.2 C) 98.4 F (36.9 C) 98.6 F (37 C)  TempSrc:  Oral Oral Oral  SpO2: 99% 97% 98% 100%  Weight: 62 kg 58.4 kg    Height:       Supplemental O2: Room Air SpO2: 100 %   Physical Exam:  Physical Exam Vitals and nursing note reviewed.  Constitutional:      General: She is not in acute distress.    Appearance: Normal appearance. She is not ill-appearing, toxic-appearing or diaphoretic.  Cardiovascular:     Rate and Rhythm: Normal rate and regular rhythm.     Pulses: Normal pulses.     Heart sounds: Normal heart sounds. No murmur heard.  No friction rub. No gallop.   Pulmonary:     Effort: Pulmonary effort is normal. No respiratory distress.     Breath sounds: Normal breath sounds. No stridor. No wheezing, rhonchi or rales.  Abdominal:     General: Abdomen is flat.     Palpations: Abdomen is soft.     Tenderness: There is no abdominal tenderness. There is no right CVA tenderness, left CVA tenderness, guarding or rebound.  Musculoskeletal:     Right lower leg: No edema.     Left lower leg: No edema.  Skin:    General: Skin is warm and dry.  Neurological:     General: No  focal deficit present.     Mental Status: She is alert and oriented to person, place, and time. Mental status is at baseline.  Psychiatric:        Mood and Affect: Mood normal.        Behavior: Behavior normal.     Filed Weights   10/24/19 0133 10/24/19 1720 10/24/19 2125  Weight: 62.6 kg 62 kg 58.4 kg     Intake/Output Summary (Last 24 hours) at 10/25/2019 0603 Last data filed at 10/25/2019 0200 Gross per 24 hour  Intake 240 ml  Output 0 ml  Net 240 ml   Net IO Since Admission: 240 mL [10/25/19 0603]  Pertinent Labs: CBC Latest Ref Rng & Units 10/24/2019 07/03/2019 07/11/2010  WBC 4.0 - 10.5 K/uL 6.6 6.3 6.0  Hemoglobin 12.0 - 15.0 g/dL 12.4 12.9 10.9(L)  Hematocrit 36 - 46 % 38.8 40.0 34.0(L)  Platelets 150 - 400 K/uL 263 289 283    CMP Latest Ref Rng & Units 10/24/2019 07/03/2019  Glucose 70 - 99 mg/dL 98 115(H)  BUN 6 - 20 mg/dL 11 14  Creatinine 0.44 - 1.00 mg/dL 0.49 0.55  Sodium 135 - 145 mmol/L 137 138  Potassium 3.5 - 5.1  mmol/L 3.6 3.5  Chloride 98 - 111 mmol/L 102 104  CO2 22 - 32 mmol/L 25 24  Calcium 8.9 - 10.3 mg/dL 9.1 9.2    Imaging: CT Renal Stone Study  Result Date: 10/24/2019 CLINICAL DATA:  Left flank pain EXAM: CT ABDOMEN AND PELVIS WITHOUT CONTRAST TECHNIQUE: Multidetector CT imaging of the abdomen and pelvis was performed following the standard protocol without oral or IV contrast. COMPARISON:  None. FINDINGS: Lower chest: Lung bases are clear. Hepatobiliary: No focal liver lesions are evident on this noncontrast enhanced study. The gallbladder wall is not appreciably thickened. There is no biliary duct dilatation. Pancreas: There is no pancreatic mass or inflammatory focus. Spleen: No splenic lesions are evident. Adrenals/Urinary Tract: Adrenals bilaterally appear normal. There is a 3 mm probable hyperdense cyst arising from the upper pole left kidney. No other evident renal mass. There is slight hydronephrosis on the left. There is no hydronephrosis  on the right. There is no intrarenal calculus on either side. There is no appreciable ureteral calculus on either side. Urinary bladder is midline with wall thickness within normal limits. Stomach/Bowel: There is no appreciable bowel wall or mesenteric thickening. There is moderate stool in the colon. There is no evident bowel obstruction. The terminal ileum appears normal. There is no free air or portal venous air. Vascular/Lymphatic: There is no abdominal aortic aneurysm. No vascular lesions are evident on this noncontrast enhanced study. No adenopathy by size criteria is evident in the abdomen pelvis. There are several subcentimeter lymph nodes in the right lower quadrant. Reproductive: Uterus is anteverted.  No evident adnexal mass. Other: The appendix measures 11 mm in thickness. There are several small appendicoliths present. There is equivocal soft tissue stranding along the distal appendix. No periappendiceal fluid or abscess. No perforation. No abscess or ascites in the abdomen or pelvis. Slight fat in the umbilicus. Musculoskeletal: There is degenerative change in the lumbar spine with vacuum phenomenon at L5-S1. No blastic or lytic bone lesions are evident. No intramuscular lesions are evident. IMPRESSION: 1. The appendix is prominent measuring 11 mm in thickness. Subtle periappendiceal soft tissue stranding. Concern for early appendiceal inflammation. Advise appropriate clinical and laboratory assessment. Surgical consultation advisable given this appearance. Appendix: Location: Appendix arises from the cecum and extends medially and slightly superiorly with the appendix at the superior aspect of the iliac crest level on the right. Diameter: 11 mm Appendicolith:: 4 mm appendicular in the mid to distal appendix with smaller calcifications in the appendix nearby. Mucosal hyper-enhancement: Noncontrast study Extraluminal gas: None Periappendiceal collection: none.  Mild soft tissue stranding. 2. Slight  hydronephrosis on the left without renal or ureteral calculus. Question recent calculus passage. Early pyelonephritis could present in this manner. 3.  No bowel obstruction.  No abscess in the abdomen or pelvis. 4. Several lymph nodes in the right lower quadrant potentially may have inflammatory etiology given apparent appendiceal abnormality. Critical Value/emergent results were called by telephone at the time of interpretation on 10/24/2019 at 8:30 am to provider [facility] , who verbally acknowledged these results. Electronically Signed   By: Lowella Grip III M.D.   On: 10/24/2019 08:30   Assessment/Plan:   Active Problems:   Pyelonephritis  Patient Summary: Linda Decker is a 49 y.o. with a no pertinent past medical history who presented with dysuria, fever, chills, flank pain and admitted for pyelonephritis with IV antibiotic treatment.   Left Sided Pyelonephritis Patient with resolution of pyelonephritis symptoms, no longer endorses left CVA tenderness, fever,  chills. Unremarkable vitals, CBC, CMP. Patient received two doses of ceftriaxone while admitted. Stable to be discharged home on oral antibiotics with return precautions. Will continue antibiotic treatment for 12 more days, total of 14. -continue cefdinir starting tomorrow for 12 days -follow up with [facility] clinic next week  Consult for COVID-19 vaccination: Due to side effects of the COVID vaccine, patient could have overlap of return precaution symptoms with COVID side effects. Recommend patient receive vaccine in coming week and to bring the topic up at her clinic follow up appointment.   Diet: Heart Healthy IVF: None,None VTE: Enoxaparin Code: Full PT/OT recs: None, none.  Dispo: Anticipated discharge to Home today.   Sanjuana Letters DO Internal Medicine Resident PGY-1 Pager [phone] Please contact the on call pager after 5 pm and on weekends at [phone].

## 2019-10-25 NOTE — Progress Notes (Signed)
Patient ID: Linda Decker, female   DOB: 1970/07/02, 49 y.o.   MRN: [id number] [facility] Surgery Progress Note:   * No surgery found *  Subjective: Mental status is clear.  Complaints none. Objective: Vital signs in last 24 hours: Temp:  [98 F (36.7 C)-98.9 F (37.2 C)] 98 F (36.7 C) (10/16 0915) Pulse Rate:  [57-70] 60 (10/16 0915) Resp:  [16-18] 18 (10/16 0915) BP: (111-131)/(62-81) 123/65 (10/16 0915) SpO2:  [96 %-100 %] 99 % (10/16 0915) Weight:  [58.4 kg-62 kg] 58.4 kg (10/15 2125)  Intake/Output from previous day: 10/15 0701 - 10/16 0700 In: 240 [P.O.:240] Out: 0  Intake/Output this shift: Total I/O In: 200 [P.O.:200] Out: -   Physical Exam: Work of breathing is normal;  No pain in the abdomen particularly the right lower quadrant with palpation.    Lab Results:  Results for orders placed or performed during the hospital encounter of 10/24/19 (from the past 48 hour(s))  Urinalysis, Routine w reflex microscopic Urine, Clean Catch     Status: Abnormal   Collection Time: 10/24/19  1:38 AM  Result Value Ref Range   Color, Urine YELLOW YELLOW   APPearance CLOUDY (A) CLEAR   Specific Gravity, Urine 1.011 1.005 - 1.030   pH 7.0 5.0 - 8.0   Glucose, UA NEGATIVE NEGATIVE mg/dL   Hgb urine dipstick LARGE (A) NEGATIVE   Bilirubin Urine NEGATIVE NEGATIVE   Ketones, ur NEGATIVE NEGATIVE mg/dL   Protein, ur 100 (A) NEGATIVE mg/dL   Nitrite NEGATIVE NEGATIVE   Leukocytes,Ua LARGE (A) NEGATIVE   RBC / HPF >50 (H) 0 - 5 RBC/hpf   WBC, UA >50 (H) 0 - 5 WBC/hpf   Bacteria, UA RARE (A) NONE SEEN    Comment: Performed at [facility], [address], [place], [place] [address]  Basic metabolic panel     Status: None   Collection Time: 10/24/19  6:47 AM  Result Value Ref Range   Sodium 137 135 - 145 mmol/L   Potassium 3.6 3.5 - 5.1 mmol/L   Chloride 102 98 - 111 mmol/L   CO2 25 22 - 32 mmol/L   Glucose, Bld 98 70 - 99 mg/dL    Comment: Glucose reference range  applies only to samples taken after fasting for at least 8 hours.   BUN 11 6 - 20 mg/dL   Creatinine, Ser 0.49 0.44 - 1.00 mg/dL   Calcium 9.1 8.9 - 10.3 mg/dL   GFR, Estimated >60 >60 mL/min   Anion gap 10 5 - 15    Comment: Performed at [facility] [address] [place], [place] [address]  CBC with Differential     Status: None   Collection Time: 10/24/19  6:47 AM  Result Value Ref Range   WBC 6.6 4.0 - 10.5 K/uL   RBC 4.34 3.87 - 5.11 MIL/uL   Hemoglobin 12.4 12.0 - 15.0 g/dL   HCT 38.8 36 - 46 %   MCV 89.4 80.0 - 100.0 fL   MCH 28.6 26.0 - 34.0 pg   MCHC 32.0 30.0 - 36.0 g/dL   RDW 13.8 11.5 - 15.5 %   Platelets 263 150 - 400 K/uL   nRBC 0.0 0.0 - 0.2 %   Neutrophils Relative % 64 %   Neutro Abs 4.2 1.7 - 7.7 K/uL   Lymphocytes Relative 28 %   Lymphs Abs 1.8 0.7 - 4.0 K/uL   Monocytes Relative 6 %   Monocytes Absolute  0.4 0.1 - 1.0 K/uL   Eosinophils Relative 1 %   Eosinophils Absolute 0.0 0.0 - 0.5 K/uL   Basophils Relative 1 %   Basophils Absolute 0.0 0.0 - 0.1 K/uL   Immature Granulocytes 0 %   Abs Immature Granulocytes 0.01 0.00 - 0.07 K/uL    Comment: Performed at [facility] [address] [place], [place] [address]  Resp Panel by RT PCR (RSV, Flu A&B, Covid) - Nasopharyngeal Swab     Status: None   Collection Time: 10/24/19  8:55 AM   Specimen: Nasopharyngeal Swab  Result Value Ref Range   SARS Coronavirus 2 by RT PCR NEGATIVE NEGATIVE    Comment: (NOTE) SARS-CoV-2 target nucleic acids are NOT DETECTED.  The SARS-CoV-2 RNA is generally detectable in upper respiratoy specimens during the acute phase of infection. The lowest concentration of SARS-CoV-2 viral copies this assay can detect is 131 copies/mL. A negative result does not preclude SARS-Cov-2 infection and should not be used as the sole basis for treatment or other patient management decisions. A negative result may occur with  improper specimen collection/handling, submission of  specimen other than nasopharyngeal swab, presence of viral mutation(s) within the areas targeted by this assay, and inadequate number of viral copies (<131 copies/mL). A negative result must be combined with clinical observations, patient history, and epidemiological information. The expected result is Negative.  Fact Sheet for Patients:  PinkCheek.be  Fact Sheet for Healthcare Providers:  GravelBags.it  This test is no t yet approved or cleared by the [place] FDA and  has been authorized for detection and/or diagnosis of SARS-CoV-2 by FDA under an Emergency Use Authorization (EUA). This EUA will remain  in effect (meaning this test can be used) for the duration of the COVID-19 declaration under Section 564(b)(1) of the Act, 21 U.S.C. section 360bbb-3(b)(1), unless the authorization is terminated or revoked sooner.     Influenza A by PCR NEGATIVE NEGATIVE   Influenza B by PCR NEGATIVE NEGATIVE    Comment: (NOTE) The Xpert Xpress SARS-CoV-2/FLU/RSV assay is intended as an aid in  the diagnosis of influenza from Nasopharyngeal swab specimens and  should not be used as a sole basis for treatment. Nasal washings and  aspirates are unacceptable for Xpert Xpress SARS-CoV-2/FLU/RSV  testing.  Fact Sheet for Patients: PinkCheek.be  Fact Sheet for Healthcare Providers: GravelBags.it  This test is not yet approved or cleared by the [place] FDA and  has been authorized for detection and/or diagnosis of SARS-CoV-2 by  FDA under an Emergency Use Authorization (EUA). This EUA will remain  in effect (meaning this test can be used) for the duration of the  Covid-19 declaration under Section 564(b)(1) of the Act, 21  U.S.C. section 360bbb-3(b)(1), unless the authorization is  terminated or revoked.    Respiratory Syncytial Virus by PCR NEGATIVE NEGATIVE     Comment: (NOTE) Fact Sheet for Patients: PinkCheek.be  Fact Sheet for Healthcare Providers: GravelBags.it  This test is not yet approved or cleared by the [place] FDA and  has been authorized for detection and/or diagnosis of SARS-CoV-2 by  FDA under an Emergency Use Authorization (EUA). This EUA will remain  in effect (meaning this test can be used) for the duration of the  COVID-19 declaration under Section 564(b)(1) of the Act, 21 U.S.C.  section 360bbb-3(b)(1), unless the authorization is terminated or  revoked. Performed at [facility], [place] [address], [place], [place] [address]   Urine culture  Status: Abnormal (Preliminary result)   Collection Time: 10/24/19  9:07 AM   Specimen: Urine, Clean Catch  Result Value Ref Range   Specimen Description URINE, CLEAN CATCH    Special Requests      NONE Performed at [facility], [address], [place], [place] [address]    Culture >=100,000 COLONIES/mL GRAM NEGATIVE RODS (A)    Report Status PENDING   HIV Antibody (routine testing w rflx)     Status: None   Collection Time: 10/24/19 12:40 PM  Result Value Ref Range   HIV Screen 4th Generation wRfx Non Reactive Non Reactive    Comment: Performed at [facility], [place] [address], [place], [place] [address]  Pregnancy, urine     Status: None   Collection Time: 10/24/19 12:47 PM  Result Value Ref Range   Preg Test, Ur NEGATIVE NEGATIVE    Comment:        THE SENSITIVITY OF THIS METHODOLOGY IS >20 mIU/mL. Performed at [facility], [place] [address] [place] [address]   CBC     Status: None   Collection Time: 10/25/19  5:58 AM  Result Value Ref Range   WBC 4.2 4.0 - 10.5 K/uL   RBC 4.26 3.87 - 5.11 MIL/uL   Hemoglobin 13.4 12.0 - 15.0 g/dL   HCT 38.3 36 - 46 %   MCV 89.9 80.0 - 100.0 fL   MCH 31.5 26.0 - 34.0 pg   MCHC 35.0 30.0 - 36.0 g/dL   RDW 14.2 11.5 - 15.5 %   Platelets  338 150 - 400 K/uL   nRBC 0.0 0.0 - 0.2 %    Comment: Performed at [facility], [place] [address] [place], [place] [address]  Comprehensive metabolic panel     Status: None   Collection Time: 10/25/19  5:58 AM  Result Value Ref Range   Sodium 141 135 - 145 mmol/L   Potassium 4.2 3.5 - 5.1 mmol/L   Chloride 108 98 - 111 mmol/L   CO2 23 22 - 32 mmol/L   Glucose, Bld 96 70 - 99 mg/dL    Comment: Glucose reference range applies only to samples taken after fasting for at least 8 hours.   BUN 15 6 - 20 mg/dL   Creatinine, Ser 0.52 0.44 - 1.00 mg/dL   Calcium 9.0 8.9 - 10.3 mg/dL   Total Protein 6.9 6.5 - 8.1 g/dL   Albumin 3.5 3.5 - 5.0 g/dL   AST 21 15 - 41 U/L   ALT 19 0 - 44 U/L   Alkaline Phosphatase 61 38 - 126 U/L   Total Bilirubin 0.8 0.3 - 1.2 mg/dL   GFR, Estimated >60 >60 mL/min   Anion gap 10 5 - 15    Comment: Performed at [facility] [address], [place],  [address]    Radiology/Results: CT Renal Stone Study  Result Date: 10/24/2019 CLINICAL DATA:  Left flank pain EXAM: CT ABDOMEN AND PELVIS WITHOUT CONTRAST TECHNIQUE: Multidetector CT imaging of the abdomen and pelvis was performed following the standard protocol without oral or IV contrast. COMPARISON:  None. FINDINGS: Lower chest: Lung bases are clear. Hepatobiliary: No focal liver lesions are evident on this noncontrast enhanced study. The gallbladder wall is not appreciably thickened. There is no biliary duct dilatation. Pancreas: There is no pancreatic mass or inflammatory focus. Spleen: No splenic lesions are evident. Adrenals/Urinary Tract: Adrenals bilaterally appear normal. There is a 3  mm probable hyperdense cyst arising from the upper pole left kidney. No other evident renal mass. There is slight hydronephrosis on the left. There is no hydronephrosis on the right. There is no intrarenal calculus on either side. There is no appreciable ureteral calculus on either side. Urinary bladder is midline  with wall thickness within normal limits. Stomach/Bowel: There is no appreciable bowel wall or mesenteric thickening. There is moderate stool in the colon. There is no evident bowel obstruction. The terminal ileum appears normal. There is no free air or portal venous air. Vascular/Lymphatic: There is no abdominal aortic aneurysm. No vascular lesions are evident on this noncontrast enhanced study. No adenopathy by size criteria is evident in the abdomen pelvis. There are several subcentimeter lymph nodes in the right lower quadrant. Reproductive: Uterus is anteverted.  No evident adnexal mass. Other: The appendix measures 11 mm in thickness. There are several small appendicoliths present. There is equivocal soft tissue stranding along the distal appendix. No periappendiceal fluid or abscess. No perforation. No abscess or ascites in the abdomen or pelvis. Slight fat in the umbilicus. Musculoskeletal: There is degenerative change in the lumbar spine with vacuum phenomenon at L5-S1. No blastic or lytic bone lesions are evident. No intramuscular lesions are evident. IMPRESSION: 1. The appendix is prominent measuring 11 mm in thickness. Subtle periappendiceal soft tissue stranding. Concern for early appendiceal inflammation. Advise appropriate clinical and laboratory assessment. Surgical consultation advisable given this appearance. Appendix: Location: Appendix arises from the cecum and extends medially and slightly superiorly with the appendix at the superior aspect of the iliac crest level on the right. Diameter: 11 mm Appendicolith:: 4 mm appendicular in the mid to distal appendix with smaller calcifications in the appendix nearby. Mucosal hyper-enhancement: Noncontrast study Extraluminal gas: None Periappendiceal collection: none.  Mild soft tissue stranding. 2. Slight hydronephrosis on the left without renal or ureteral calculus. Question recent calculus passage. Early pyelonephritis could present in this manner. 3.   No bowel obstruction.  No abscess in the abdomen or pelvis. 4. Several lymph nodes in the right lower quadrant potentially may have inflammatory etiology given apparent appendiceal abnormality. Critical Value/emergent results were called by telephone at the time of interpretation on 10/24/2019 at 8:30 am to provider [facility] , who verbally acknowledged these results. Electronically Signed   By: Lowella Grip III M.D.   On: 10/24/2019 08:30    Anti-infectives: Anti-infectives (From admission, onward)   Start     Dose/Rate Route Frequency Ordered Stop   10/26/19 0000  cefdinir (OMNICEF) 300 MG capsule        300 mg Oral 2 times daily 10/25/19 1143 11/07/19 2359   10/25/19 1300  cefTRIAXone (ROCEPHIN) 2 g in sodium chloride 0.9 % 100 mL IVPB        2 g 200 mL/hr over 30 Minutes Intravenous Every 24 hours 10/24/19 1210     10/25/19 0000  cefdinir (OMNICEF) 300 MG capsule  Status:  Discontinued        300 mg Oral 2 times daily 10/25/19 1132 10/25/19    10/24/19 1300  cefTRIAXone (ROCEPHIN) 1 g in sodium chloride 0.9 % 100 mL IVPB        1 g 200 mL/hr over 30 Minutes Intravenous NOW 10/24/19 1255 10/24/19 1358   10/24/19 0815  cefTRIAXone (ROCEPHIN) 1 g in sodium chloride 0.9 % 100 mL IVPB        1 g 200 mL/hr over 30 Minutes Intravenous  Once 10/24/19 0806 10/24/19 0911  Assessment/Plan: Problem List: Patient Active Problem List   Diagnosis Date Noted  . Pyelonephritis 10/24/2019    CT without contrast reviewed.  Clinically there is no appendicitis.  She can be managed expectantly and if there is some occurrence of pain then CT with contrast both IV and po would be optimal.   * No surgery found *    LOS: 1 day   Matt B. Hassell Done, MD, [facility] [phone] to reach the surgeon on call.    10/25/2019 1:08 PM

## 2019-10-25 NOTE — Discharge Summary (Addendum)
Name: Linda Decker MRN: [id number] DOB: December 31, 1970 49 y.o. PCP: Patient, No Pcp Per  Date of Admission: 10/24/2019  1:34 AM Date of Discharge: 10/25/2019 Attending Physician: Elenora Fender MD   Discharge Diagnosis: 1. Acute pyonephritis 2. Enlarged appendix   Discharge Medications: Allergies as of 10/25/2019   No Known Allergies     Medication List    STOP taking these medications   CALCIUM-D PO   hydrOXYzine 25 MG tablet Commonly known as: ATARAX/VISTARIL     TAKE these medications   cefdinir 300 MG capsule Commonly known as: OMNICEF Take 1 capsule (300 mg total) by mouth 2 (two) times daily for 12 days. Start taking on: October 26, 2019   multivitamin tablet Take 1 tablet by mouth daily.       Disposition and follow-up:   Ms.Tamkia G Norma Fredrickson was discharged from [facility] in Stable condition.  At the hospital follow up visit please address:  1. Pyelonephritis and/or renal stone: Patient was discharged home on oral antibiotic therapy, please assess compliance and symptom improvement. There was suspicion that patient passed a renal stone as well, calcium supplement was held at discharge. Will need to determine if patient needs to resume calcium supplement.  COVID-19 vaccination: Patient is interested in receiving Covid vaccination. Due to side effects of the vaccine and patient having overlap of return precautions symptoms was advised to address this at follow-up.  2.  Labs / imaging needed at time of follow-up: none  3.  Pending labs/ test needing follow-up: none  Follow-up Appointments:  [facility] Follow up in 1 week(s).   Specialty: Internal Medicine Contact information: [address] [phone] [place] [facility] Course by problem list: 1. Acute pyelonephritis 2. Enlarged appendix   Patient presented  with dysuria, fever, chills and flank pain. Urinalysis demonstrated pyuria. CT abdomen demonstrated mild left-sided hydronephrosis without evidence of renal or ureteral stone. There was a question of recent calculus passage versus pyonephritis. Additionally noted an enlarged appendix with appendicolith on imaging, general surgery was consulted and appendicitis ruled out given her clinical exam and history. She was started on IV ceftriaxone and transition to cefdinir for 12 more days to complete a 14-day course. Discontinued oral calcium supplements at discharge.   Discharge Vitals:   BP 123/65 (BP Location: Right Arm)   Pulse 60   Temp 98 F (36.7 C) (Oral)   Resp 18   Ht 5' (1.524 m)   Wt 58.4 kg   SpO2 99%   BMI 25.14 kg/m   Pertinent Labs, Studies, and Procedures:   BMP Latest Ref Rng & Units 10/25/2019 10/24/2019 07/03/2019  Glucose 70 - 99 mg/dL 96 98 115(H)  BUN 6 - 20 mg/dL 15 11 14   Creatinine 0.44 - 1.00 mg/dL 0.52 0.49 0.55  Sodium 135 - 145 mmol/L 141 137 138  Potassium 3.5 - 5.1 mmol/L 4.2 3.6 3.5  Chloride 98 - 111 mmol/L 108 102 104  CO2 22 - 32 mmol/L 23 25 24   Calcium 8.9 - 10.3 mg/dL 9.0 9.1 9.2    Urinalysis    Component Value Date/Time   COLORURINE YELLOW 10/24/2019 0138   APPEARANCEUR CLOUDY (A) 10/24/2019 0138   LABSPEC 1.011 10/24/2019 0138   PHURINE 7.0 10/24/2019 0138   GLUCOSEU NEGATIVE 10/24/2019 0138   HGBUR LARGE (A) 10/24/2019 0138   BILIRUBINUR  NEGATIVE 10/24/2019 0138   KETONESUR NEGATIVE 10/24/2019 0138   PROTEINUR 100 (A) 10/24/2019 0138   UROBILINOGEN 0.2 08/21/2019 1859   NITRITE NEGATIVE 10/24/2019 0138   LEUKOCYTESUR LARGE (A) 10/24/2019 0138   CT renal stone study  IMPRESSION: 1. The appendix is prominent measuring 11 mm in thickness. Subtle periappendiceal soft tissue stranding. Concern for early appendiceal inflammation. Advise appropriate clinical and laboratory assessment. Surgical consultation advisable given this  appearance.  Appendix: Location: Appendix arises from the cecum and extends medially and slightly superiorly with the appendix at the superior aspect of the iliac crest level on the right.  Diameter: 11 mm  Appendicolith:: 4 mm appendicular in the mid to distal appendix with smaller calcifications in the appendix nearby.  Mucosal hyper-enhancement: Noncontrast study  Extraluminal gas: None  Periappendiceal collection: none.  Mild soft tissue stranding.  2. Slight hydronephrosis on the left without renal or ureteral calculus. Question recent calculus passage. Early pyelonephritis could present in this manner.  3.  No bowel obstruction.  No abscess in the abdomen or pelvis.  4. Several lymph nodes in the right lower quadrant potentially may have inflammatory etiology given apparent appendiceal abnormality.  Discharge Instructions: Discharge Instructions    Call MD for:  difficulty breathing, headache or visual disturbances   Complete by: As directed    Call MD for:  persistant dizziness or light-headedness   Complete by: As directed    Call MD for:  persistant nausea and vomiting   Complete by: As directed    Call MD for:  redness, tenderness, or signs of infection (pain, swelling, redness, odor or green/yellow discharge around incision site)   Complete by: As directed    Call MD for:  severe uncontrolled pain   Complete by: As directed    Call MD for:  temperature >100.4   Complete by: As directed    Diet - low sodium heart healthy   Complete by: As directed    Increase activity slowly   Complete by: As directed       Signed: Seba Madole N, DO 10/25/2019, 12:07 PM

## 2019-10-25 NOTE — Progress Notes (Signed)
DISCHARGE NOTE HOME  Linda Decker to be discharged Home per MD order. Discussed prescriptions and follow up appointments with the patient. Prescriptions given to patient; medication list explained in detail. Patient verbalized understanding.  Skin clean, dry and intact without evidence of skin break down, no evidence of skin tears noted. IV catheter discontinued intact. Site without signs and symptoms of complications. Dressing and pressure applied. Pt denies pain at the site currently. No complaints noted.  Patient free of lines, drains, and wounds.   An After Visit Summary (AVS) was printed and given to the patient. Both the [place] and Spanish version were given to the patient Patient escorted via wheelchair, and discharged home via private auto.  Beatris Ship, RN

## 2019-10-25 NOTE — Discharge Instructions (Addendum)
Linda Decker, gracias por permitirnos atenderla durante su estada en el hospital. Me alegra que se sienta mejor y que su dolor de espalda se haya resuelto. Durante el ingreso al hospital, recibi antibiticos y lquidos por va intravenosa. Su tomografa computarizada mostr hinchazn en su rin que puede deberse a un clculo renal o una posible infeccin. Tambin se observ que su apndice estaba agrandado, sin embargo, no parece tener una infeccin [place]. Le escribiremos una receta para tomar antibiticos durante [address] a partir de [place]. tome tylenol para cualquier dolor continuo  Si tiene ms dolor abdominal, dirjase al servicio de urgencias ms cercano. De lo contrario, la clnica lo llamar la prxima semana para programar una cita.  Deje de tomar sus suplementos de calcio hasta que vea a un mdico. Pueden estar contribuyendo a sus clculos renales.   Hable con la clnica sobre la vacunacin contra la covid. Se utiliz el traductor de [employer] para esta traduccin.   Pielonefritis en los adultos Pyelonephritis, Adult  La pielonefritis es una infeccin que se produce en el rin. Los riones son los rganos que ayudan a limpiar la sangre al [employer] residuos a travs de la [place]. Esta infeccin puede curarse rpidamente o durar [employer]. En la [employer], desaparece con el tratamiento y no causa otros problemas. Cules son las causas? Esta afeccin puede ser causada por lo siguiente:  Grmenes (bacterias) que se trasladan de la vejiga al rin. Esto puede suceder despus de tener una infeccin en la vejiga.  Grmenes que se trasladan de la sangre al rin. Qu incrementa el riesgo? Es ms probable que esta afeccin se manifieste en:  Mujeres embarazadas.  Personas de edad avanzada.  Personas que tienen alguna de estas afecciones: ? Diabetes. ? Inflamacin de la prstata (prostatitis) en los hombres. ? Clculos renales o en la vejiga. ? Otros problemas en  el rin o en las partes del cuerpo que transportan la orina desde los riones hasta la vejiga (urteres). ? Cncer.  Las [employer] tienen un tubo delgado y pequeo (catter) colocado en la vejiga.  Las personas que son sexualmente activas.  Las mujeres que usan un medicamento que [employer] espermatozoides (espermicida) para Therapist, occupational.  Las personas que han tenido una infeccin urinaria (IU) previa. Cules son los signos o los sntomas? Los sntomas de esta afeccin incluyen:  Hacer pis con frecuencia.  Necesidad intensa de Garment/textile technologist de inmediato.  Sensacin de ardor o escozor al [employer].  Dolor abdominal.  Dolor de espalda.  Dolor al costado del cuerpo (fosa lumbar).  Fiebre o escalofros.  Sangre en la [place] u [place].  Malestar estomacal (nuseas) o ganas de devolver (vmitos). Cmo se trata? El tratamiento para esta afeccin puede incluir lo siguiente:  Tomar antibiticos por boca (va oral).  Beber la cantidad suficiente de lquido. Si la infeccin es grave, es posible que [facility]. Pueden darle antibiticos y lquidos que se colocan directamente en una vena a travs de un tubo (catter) intravenoso. En algunos casos, pueden necesitarse otros tratamientos. Siga estas instrucciones en su casa: [place] los [employer] se lo haya indicado el mdico. No deje de tomar el antibitico aunque comience a sentirse mejor.  Tome los medicamentos de venta libre y los recetados solamente como se lo haya indicado el mdico. Instrucciones generales   Beba suficiente lquido como para Theatre manager la orina de color amarillo plido.  Evite la cafena, el t y las bebidas gaseosas.  Orine con frecuencia. Evite retener la orina durante largos perodos.  Orine antes y despus [place].  Despus de las deposiciones, las mujeres deben higienizarse desde adelante hacia atrs. Use slo un papel tissue por  vez.  Concurra a todas las visitas de seguimiento como se lo haya indicado el mdico. Esto es importante. Comunquese con un mdico si:  No mejora luego de 2 das.  Sus sntomas empeoran.  Tiene fiebre. Solicite ayuda inmediatamente si:  No puede tomar los medicamentos o beber lquidos segn las indicaciones.  Siente [place] a Emergency planning/management officer.  Vomita.  Tiene un dolor muy intenso en el costado o en la espalda.  Se siente muy dbil o se desvanece (se desmaya). Resumen  La pielonefritis es una infeccin que se produce en el rin.  En la [employer], esta infeccin desaparece con el tratamiento y no causa otros problemas.  Tome los antibiticos como se lo haya indicado el mdico. No deje de tomar el antibitico aunque comience a sentirse mejor.  Beba suficiente lquido como para Theatre manager la orina de color amarillo plido. Esta informacin no tiene Marine scientist el consejo del mdico. Asegrese de hacerle al mdico cualquier pregunta que tenga. Document Revised: 12/07/2017 Document Reviewed: 12/07/2017 Elsevier Patient Education  2020 [employer].

## 2019-10-26 LAB — URINE CULTURE: Culture: >=100000 — AB

## 2019-10-27 ENCOUNTER — Telehealth: Payer: Self-pay | Admitting: General Practice

## 2019-10-27 NOTE — Telephone Encounter (Signed)
TOC 11/05/2019 at 1:45 pm.   Called patient via [place] interpreter. No answer, message left on voicemail.Marland Kitchen  Appt made and mailed.

## 2019-10-27 NOTE — Telephone Encounter (Signed)
-----   Message from Riesa Pope, MD sent at 10/25/2019 10:19 AM EDT ----- Good Morning!  Please schedule this patient for a follow hospitalization/new patient appointment for next week. Thank you!

## 2019-11-05 ENCOUNTER — Other Ambulatory Visit: Payer: Self-pay

## 2019-11-05 ENCOUNTER — Ambulatory Visit (INDEPENDENT_AMBULATORY_CARE_PROVIDER_SITE_OTHER): Payer: Self-pay | Admitting: Internal Medicine

## 2019-11-05 ENCOUNTER — Encounter: Payer: Self-pay | Admitting: Internal Medicine

## 2019-11-05 DIAGNOSIS — N12 Tubulo-interstitial nephritis, not specified as acute or chronic: Secondary | ICD-10-CM

## 2019-11-05 DIAGNOSIS — Z0000 Encounter for general adult medical examination without abnormal findings: Secondary | ICD-10-CM

## 2019-11-05 NOTE — Progress Notes (Signed)
   CC: flank pain  HPI: Ms.Shannie Darnell Level Norma Fredrickson is a 49 y.o. with PMH listed below presenting with complaint of . Please see problem based assessment and plan for further details.  No past medical history on file.  Family History: Denies any clinically significant family history  Social History: Works in Nurse, mental health. Lives with husband and children. Denies tobacco, illicit drug use. Drinks only on family events  Review of Systems: Review of Systems  Constitutional: Negative for chills, fever and malaise/fatigue.  Eyes: Negative for blurred vision.  Respiratory: Negative for cough and shortness of breath.   Cardiovascular: Negative for chest pain, palpitations and leg swelling.  Gastrointestinal: Negative for constipation, diarrhea, nausea and vomiting.  Genitourinary: Negative for dysuria, flank pain, frequency, hematuria and urgency.  All other systems reviewed and are negative.    Physical Exam: Vitals:   11/05/19 1445  BP: (!) 115/53  Pulse: 65  Temp: 97.7 F (36.5 C)  TempSrc: Oral  SpO2: 98%  Weight: 137 lb 6.4 oz (62.3 kg)  Height: 5' 2"  (1.575 m)   Gen: Well-developed, well nourished, NAD HEENT: NCAT head, hearing intact CV: RRR, S1, S2 normal Pulm: CTAB, No rales, no wheezes Abd: NTND, BS+, no cva tenderness Extm: ROM intact, Peripheral pulses intact, No peripheral edema Skin: Dry, Warm, normal turgor, no wounds, no rashes, no lesions  Assessment & Plan:   Pyelonephritis Ms.Norma Fredrickson is a 49 yo F w/ no significant PMH presenting to [facility] for hospital follow up visit. She had a recent admission at [facility] after presenting with flank pain and was found to have pyelonephritis. She was discharged with oral cefdinir to complete 14 day course and is currently on day 13/14. She mentions that she has been feeling well since discharge and denies any fevers, chills, nausea, vomiting diarrhea. She continued to endorse mild dysuria and flank pain for a day or two after discharge  but currently feels well without significant symptoms.  A/P Recovered well after recent admission for pyelo. Urine culture grew pan-sensitive E.coli. Discussed lifestyle modifications to prevent recurrent UTI and pyelo. No need for further work-up at this time. - C/w cefdinir  Healthcare maintenance Advised on screening labs to assess for cholesterol, diabetes, cervical cancer, kidney disease. Mentions concern regarding financial situation as she is currently uninsured. Discussed following up with financial counselor in clinic to establish coverage.     Patient discussed with Dr.Williams  -Gilberto Better, PGY3 [facility] Pager: [phone]

## 2019-11-05 NOTE — Patient Instructions (Signed)
Dear [name],  Thank you for allowing [place] to provide your care today. Today we discussed your urinary tract infection    I have ordered no labs for you. I will call if any are abnormal.    Today we made the following changes to your medications:    Please take your multi-vitamins. No need to restart calcium meds  Please follow-up as neede.    Should you have any questions or concerns please call the internal medicine clinic at [phone].    Thank you for choosing [facility].

## 2019-11-07 ENCOUNTER — Encounter: Payer: Self-pay | Admitting: Internal Medicine

## 2019-11-07 DIAGNOSIS — Z0000 Encounter for general adult medical examination without abnormal findings: Secondary | ICD-10-CM | POA: Insufficient documentation

## 2019-11-07 NOTE — Assessment & Plan Note (Signed)
Advised on screening labs to assess for cholesterol, diabetes, cervical cancer, kidney disease. Mentions concern regarding financial situation as she is currently uninsured. Discussed following up with financial counselor in clinic to establish coverage.

## 2019-11-07 NOTE — Assessment & Plan Note (Signed)
Linda Decker is a 49 yo F w/ no significant PMH presenting to [facility] for hospital follow up visit. She had a recent admission at [facility] after presenting with flank pain and was found to have pyelonephritis. She was discharged with oral cefdinir to complete 14 day course and is currently on day 13/14. She mentions that she has been feeling well since discharge and denies any fevers, chills, nausea, vomiting diarrhea. She continued to endorse mild dysuria and flank pain for a day or two after discharge but currently feels well without significant symptoms.  A/P Recovered well after recent admission for pyelo. Urine culture grew pan-sensitive E.coli. Discussed lifestyle modifications to prevent recurrent UTI and pyelo. No need for further work-up at this time. - C/w cefdinir

## 2019-11-10 NOTE — Progress Notes (Signed)
Internal Medicine Clinic Attending  Case discussed with Dr. Lee  At the time of the visit.  We reviewed the resident's history and exam and pertinent patient test results.  I agree with the assessment, diagnosis, and plan of care documented in the resident's note.    

## 2020-01-15 ENCOUNTER — Ambulatory Visit: Payer: Self-pay | Attending: Internal Medicine

## 2020-01-15 DIAGNOSIS — Z23 Encounter for immunization: Secondary | ICD-10-CM

## 2020-01-15 NOTE — Progress Notes (Signed)
   Covid-19 Vaccination Clinic  Name:  Linda Decker    MRN: [id number] DOB: July 20, 1970  01/15/2020  [name] was observed post Covid-19 immunization for 15 minutes without incident. She was provided with Vaccine Information Sheet and instruction to access the V-Safe system.   [name] was instructed to call 911 with any severe reactions post vaccine: Marland Kitchen Difficulty breathing  . Swelling of face and throat  . A fast heartbeat  . A bad rash all over body  . Dizziness and weakness   Immunizations Administered    Name Date Dose VIS Date Route   Pfizer COVID-19 Vaccine 01/15/2020  3:39 PM 0.3 mL 10/29/2019 Intramuscular   Manufacturer: [place]   Lot: [id number]   [place]: [address]-8

## 2020-02-12 ENCOUNTER — Ambulatory Visit: Payer: Self-pay | Attending: Internal Medicine

## 2020-02-12 DIAGNOSIS — Z23 Encounter for immunization: Secondary | ICD-10-CM

## 2020-02-12 NOTE — Progress Notes (Signed)
   Covid-19 Vaccination Clinic  Name:  Linda Decker    MRN: [id number] DOB: 06-12-1970  02/12/2020  Linda Decker was observed post Covid-19 immunization for 15 minutes without incident. She was provided with Vaccine Information Sheet and instruction to access the V-Safe system.   Linda Decker was instructed to call 911 with any severe reactions post vaccine: Marland Kitchen Difficulty breathing  . Swelling of face and throat  . A fast heartbeat  . A bad rash all over body  . Dizziness and weakness   Immunizations Administered    Name Date Dose VIS Date Route   PFIZER Comrnaty(Gray TOP) Covid-19 Vaccine 02/12/2020  3:22 PM 0.3 mL 12/18/2019 Intramuscular   Manufacturer: [employer], [employer]   Lot: [id number]   NDC: [phone]

## 2020-09-14 ENCOUNTER — Other Ambulatory Visit: Payer: Self-pay

## 2020-09-14 ENCOUNTER — Emergency Department (HOSPITAL_COMMUNITY)
Admission: EM | Admit: 2020-09-14 | Discharge: 2020-09-15 | Disposition: A | Discharge status: Home / Self Care | Payer: Self-pay | Attending: Emergency Medicine | Admitting: Emergency Medicine

## 2020-09-14 DIAGNOSIS — Y9389 Activity, other specified: Secondary | ICD-10-CM | POA: Insufficient documentation

## 2020-09-14 DIAGNOSIS — W01198A Fall on same level from slipping, tripping and stumbling with subsequent striking against other object, initial encounter: Secondary | ICD-10-CM | POA: Insufficient documentation

## 2020-09-14 DIAGNOSIS — S0990XA Unspecified injury of head, initial encounter: Secondary | ICD-10-CM | POA: Insufficient documentation

## 2020-09-14 NOTE — ED Provider Notes (Addendum)
Emergency Medicine Provider Triage Evaluation Note  Linda Decker , a 50 y.o. female  was evaluated in triage.  Pt complains of head pain.  Patient reports that she was working out when lost her balance and hit posterior head against a wall.  Patient denies any loss of consciousness.  Patient has had constant pain since then.  Review of Systems  Positive: Head pain Negative: Nausea, vomiting, syncope, lightheadedness, facial asymmetry, slurred speech, numbness, weakness, neck pain, back pain  Physical Exam  BP (!) 150/83   Pulse 62   Temp 98.1 F (36.7 C) (Oral)   Resp 18   Ht 5' 2"  (1.575 m)   Wt 62.3 kg   SpO2 100%   BMI 25.12 kg/m  Gen:   Awake, no distress   Resp:  Normal effort  MSK:   Moves extremities without difficulty  Other:  Contusion to crown, tenderness to crown of head.  No midline tenderness or deformity to cervical, thoracic, or lumbar spine.  Medical Decision Making  Medically screening exam initiated at 9:24 PM.  Appropriate orders placed.  PHUONG HILLARY was informed that the remainder of the evaluation will be completed by another provider, this initial triage assessment does not replace that evaluation, and the importance of remaining in the ED until their evaluation is complete.  The patient appears stable so that the remainder of the work up may be completed by another provider.      Loni Beckwith, PA-C 09/14/20 2125    Loni Beckwith, PA-C 09/14/20 2128    Margette Fast, MD 09/14/20 [phone]

## 2020-09-14 NOTE — ED Triage Notes (Signed)
Pt states she fell back and hit her head on the wall. Pt c/o head pain.

## 2020-09-15 ENCOUNTER — Emergency Department (HOSPITAL_COMMUNITY): Payer: Self-pay

## 2020-09-15 MED ORDER — ACETAMINOPHEN 325 MG PO TABS
650.0000 mg | ORAL_TABLET | Freq: Once | ORAL | Status: AC
  Administered 2020-09-15: 650 mg via ORAL
  Filled 2020-09-15: qty 2

## 2020-09-15 NOTE — ED Provider Notes (Signed)
[facility] EMERGENCY DEPARTMENT Provider Note   CSN: [id number] Arrival date & time: 09/14/20  2020     History Chief Complaint  Patient presents with   Fall   Headache    Linda Decker is a 50 y.o. female.  Patient presents chief complaint of headache.  She states she was working when she lost her balance and fell backwards and hit the top of her head against the corner.  Denies loss of consciousness.  Has persistent headache.  Denies neck pain or back pain or other extremity pain.  Denies any recent illnesses no fever cough vomiting or diarrhea.      No past medical history on file.  Patient Active Problem List   Diagnosis Date Noted   Healthcare maintenance 11/07/2019   Pyelonephritis 10/24/2019    No past surgical history on file.   OB History     Gravida  1   Para  0   Term  0   Preterm  0   AB  0   Living         SAB  0   IAB  0   Ectopic  0   Multiple      Live Births              No family history on file.  Social History   Tobacco Use   Smoking status: Never   Smokeless tobacco: Never  Substance Use Topics   Alcohol use: Never   Drug use: Never    Home Medications Prior to Admission medications   Medication Sig Start Date End Date Taking? Authorizing Provider  Multiple Vitamin (MULTIVITAMIN) tablet Take 1 tablet by mouth daily.    [provider]    Allergies    Patient has no known allergies.  Review of Systems   Review of Systems  Constitutional:  Negative for fever.  HENT:  Negative for ear pain.   Eyes:  Negative for pain.  Respiratory:  Negative for cough.   Cardiovascular:  Negative for chest pain.  Gastrointestinal:  Negative for abdominal pain.  Genitourinary:  Negative for flank pain.  Musculoskeletal:  Negative for back pain.  Skin:  Negative for rash.  Neurological:  Positive for headaches.   Physical Exam Updated Vital Signs BP 120/80 (BP Location: Left Arm)   Pulse  60   Temp 98 F (36.7 C) (Oral)   Resp 14   Ht 5' 2"  (1.575 m)   Wt 62.3 kg   SpO2 98%   BMI 25.12 kg/m   Physical Exam Constitutional:      General: She is not in acute distress.    Appearance: Normal appearance.  HENT:     Head: Normocephalic.     Comments: No laceration to scalp.  Tenderness palpation in the posterior superior occipital region.    Nose: Nose normal.  Eyes:     Extraocular Movements: Extraocular movements intact.  Cardiovascular:     Rate and Rhythm: Normal rate.  Pulmonary:     Effort: Pulmonary effort is normal.  Musculoskeletal:        General: Normal range of motion.     Cervical back: Normal range of motion.     Comments: No C or T or L-spine midline step-offs or tenderness.  Patient ranging her neck and torso well without pain or discomfort.  No other extremity discomfort or pain noted on palpation or range of motion.  Neurological:     General: No focal  deficit present.     Mental Status: She is alert. Mental status is at baseline.    ED Results / Procedures / Treatments   Labs (all labs ordered are listed, but only abnormal results are displayed) Labs Reviewed - No data to display  EKG None  Radiology CT Head Wo Contrast  Result Date: 09/15/2020 CLINICAL DATA:  50 year old female status post fall backwards striking head on wall. Pain. EXAM: CT HEAD WITHOUT CONTRAST TECHNIQUE: Contiguous axial images were obtained from the base of the skull through the vertex without intravenous contrast. COMPARISON:  None. FINDINGS: Brain: No midline shift, ventriculomegaly, mass effect, evidence of mass lesion, intracranial hemorrhage or evidence of cortically based acute infarction. Gray-white matter differentiation is within normal limits throughout the brain. Vascular: No suspicious intracranial vascular hyperdensity. Skull: Intact, negative. Sinuses/Orbits: Visualized paranasal sinuses and mastoids are clear. Other: No discrete scalp soft tissue injury.  Visualized orbit soft tissues are within normal limits. IMPRESSION: Normal noncontrast Head CT.  No acute traumatic injury identified. Electronically Signed   By: Genevie Ann M.D.   On: 09/15/2020 04:21    Procedures Procedures   Medications Ordered in ED Medications  acetaminophen (TYLENOL) tablet 650 mg (650 mg Oral Given 09/15/20 0850)    ED Course  I have reviewed the triage vital signs and the nursing notes.  Pertinent labs & imaging results that were available during my care of the patient were reviewed by me and considered in my medical decision making (see chart for details).    MDM Rules/Calculators/A&P                           CT imaging unremarkable.  Patient in no acute distress at this time.  Given Tylenol for persistent headache, discharged home stable condition.  Advise follow-up with her doctor within the week advised immediate return for worsening symptoms persistent vomiting or any additional concerns.  Final Clinical Impression(s) / ED Diagnoses Final diagnoses:  Closed head injury, initial encounter    Rx / DC Orders ED Discharge Orders     None        Luna Fuse, MD 09/15/20 [phone]

## 2020-09-15 NOTE — Discharge Instructions (Addendum)
Call your primary care doctor or specialist as discussed in the next 2-3 days.   Return immediately back to the ER if:  Your symptoms worsen within the next 12-24 hours. You develop new symptoms such as new fevers, persistent vomiting, new pain, shortness of breath, or new weakness or numbness, or if you have any other concerns.  

## 2020-09-25 ENCOUNTER — Ambulatory Visit (HOSPITAL_COMMUNITY)
Admission: EM | Admit: 2020-09-25 | Discharge: 2020-09-25 | Disposition: A | Discharge status: Home / Self Care | Payer: Self-pay | Attending: Emergency Medicine | Admitting: Emergency Medicine

## 2020-09-25 ENCOUNTER — Encounter (HOSPITAL_COMMUNITY): Payer: Self-pay | Admitting: *Deleted

## 2020-09-25 ENCOUNTER — Other Ambulatory Visit: Payer: Self-pay

## 2020-09-25 DIAGNOSIS — N390 Urinary tract infection, site not specified: Secondary | ICD-10-CM | POA: Insufficient documentation

## 2020-09-25 LAB — POCT URINALYSIS DIPSTICK, ED / UC
Bilirubin Urine: NEGATIVE
Glucose, UA: NEGATIVE mg/dL
Ketones, ur: NEGATIVE mg/dL
Nitrite: NEGATIVE
Protein, ur: NEGATIVE mg/dL
Specific Gravity, Urine: 1.01 (ref 1.005–1.030)
Urobilinogen, UA: 0.2 mg/dL (ref 0.0–1.0)
pH: 7 (ref 5.0–8.0)

## 2020-09-25 MED ORDER — NITROFURANTOIN MONOHYD MACRO 100 MG PO CAPS: 100.0000 mg | ORAL_CAPSULE | Freq: Two times a day (BID) | ORAL | 0 refills | Status: DC

## 2020-09-25 NOTE — ED Triage Notes (Signed)
Pt reports dysuria started today. Pt took OTC azo with out relief.

## 2020-09-25 NOTE — ED Provider Notes (Signed)
[place]    CSN: [id number] Arrival date & time: 09/25/20  1348      History   Chief Complaint Chief Complaint  Patient presents with   Dysuria    HPI Linda Decker is a 50 y.o. female.   Patient here for evaluation of dysuria, urgency, and frequency that started this morning.  Patient reports taking an OTC AZO pill with no symptom relief.  Reports similar symptoms with UTIs in the past.  Denies any trauma, injury, or other precipitating event.  Denies any fevers, chest pain, shortness of breath, N/V/D, numbness, tingling, weakness, abdominal pain, or headaches.    The history is provided by the patient. The history is limited by a language barrier. A language interpreter was used.  Dysuria  History reviewed. No pertinent past medical history.  Patient Active Problem List   Diagnosis Date Noted   Healthcare maintenance 11/07/2019   Pyelonephritis 10/24/2019    History reviewed. No pertinent surgical history.  OB History     Gravida  1   Para  0   Term  0   Preterm  0   AB  0   Living         SAB  0   IAB  0   Ectopic  0   Multiple      Live Births               Home Medications    Prior to Admission medications   Medication Sig Start Date End Date Taking? Authorizing Provider  nitrofurantoin, macrocrystal-monohydrate, (MACROBID) 100 MG capsule Take 1 capsule (100 mg total) by mouth 2 (two) times daily. 09/25/20  Yes Pearson Forster, NP  Multiple Vitamin (MULTIVITAMIN) tablet Take 1 tablet by mouth daily.    [provider]    Family History History reviewed. No pertinent family history.  Social History Social History   Tobacco Use   Smoking status: Never   Smokeless tobacco: Never  Substance Use Topics   Alcohol use: Never   Drug use: Never     Allergies   Patient has no known allergies.   Review of Systems Review of Systems  Genitourinary:  Positive for dysuria, frequency and urgency.  All other  systems reviewed and are negative.   Physical Exam Triage Vital Signs ED Triage Vitals  Enc Vitals Group     BP 09/25/20 1357 133/67     Pulse Rate 09/25/20 1357 64     Resp 09/25/20 1357 18     Temp 09/25/20 1357 97.9 F (36.6 C)     Temp src --      SpO2 09/25/20 1357 99 %     Weight --      Height --      Head Circumference --      Peak Flow --      Pain Score 09/25/20 1358 3     Pain Loc --      Pain Edu? --      Excl. in [place]? --    No data found.  Updated Vital Signs BP 133/67   Pulse 64   Temp 97.9 F (36.6 C)   Resp 18   SpO2 99%   Visual Acuity Right Eye Distance:   Left Eye Distance:   Bilateral Distance:    Right Eye Near:   Left Eye Near:    Bilateral Near:     Physical Exam Vitals and nursing note reviewed.  Constitutional:  General: She is not in acute distress.    Appearance: Normal appearance. She is not ill-appearing, toxic-appearing or diaphoretic.  HENT:     Head: Normocephalic and atraumatic.  Eyes:     Conjunctiva/sclera: Conjunctivae normal.  Cardiovascular:     Rate and Rhythm: Normal rate.     Pulses: Normal pulses.  Pulmonary:     Effort: Pulmonary effort is normal.  Abdominal:     General: Abdomen is flat.     Tenderness: There is no right CVA tenderness or left CVA tenderness.  Musculoskeletal:        General: Normal range of motion.     Cervical back: Normal range of motion.  Skin:    General: Skin is warm and dry.  Neurological:     General: No focal deficit present.     Mental Status: She is alert and oriented to person, place, and time.  Psychiatric:        Mood and Affect: Mood normal.     UC Treatments / Results  Labs (all labs ordered are listed, but only abnormal results are displayed) Labs Reviewed  POCT URINALYSIS DIPSTICK, ED / UC - Abnormal; Notable for the following components:      Result Value   Hgb urine dipstick TRACE (*)    Leukocytes,Ua SMALL (*)    All other components within normal limits   URINE CULTURE    EKG   Radiology No results found.  Procedures Procedures (including critical care time)  Medications Ordered in UC Medications - No data to display  Initial Impression / Assessment and Plan / UC Course  I have reviewed the triage vital signs and the nursing notes.  Pertinent labs & imaging results that were available during my care of the patient were reviewed by me and considered in my medical decision making (see chart for details).    Assessment negative for red flags or concerns.  Urinalysis positive for leukocytes, urine culture pending.  We will go ahead and treat with Macrobid twice daily for the next 5 days.  May continue to take AZO for symptom relief.  Encourage fluids and rest.  Follow-up with primary care for reevaluation as it is possible. Final Clinical Impressions(s) / UC Diagnoses   Final diagnoses:  Lower urinary tract infectious disease     Discharge Instructions      Take the macrobid twice a day for the next 5 days.    You can take Tylenol and/or Ibuprofen as needed for pain relief and fever reduction.   Make sure you are drinking plenty of fluids, especially water.  You can drink cranberry juice to help with symptom relief, but make sure it is cranberry juice and not cranberry cocktail.  You can also try AZO, cranberry pills, or pyridium as needed.    Return or go to the Emergency Department if symptoms worsen or do not improve in the next few days.      ED Prescriptions     Medication Sig Dispense Auth. Provider   nitrofurantoin, macrocrystal-monohydrate, (MACROBID) 100 MG capsule Take 1 capsule (100 mg total) by mouth 2 (two) times daily. 10 capsule Pearson Forster, NP      PDMP not reviewed this encounter.   Pearson Forster, NP 09/25/20 1429

## 2020-09-25 NOTE — Discharge Instructions (Signed)
Take the macrobid twice a day for the next 5 days.    You can take Tylenol and/or Ibuprofen as needed for pain relief and fever reduction.   Make sure you are drinking plenty of fluids, especially water.  You can drink cranberry juice to help with symptom relief, but make sure it is cranberry juice and not cranberry cocktail.  You can also try AZO, cranberry pills, or pyridium as needed.    Return or go to the Emergency Department if symptoms worsen or do not improve in the next few days.

## 2020-09-26 LAB — URINE CULTURE

## 2020-11-01 ENCOUNTER — Other Ambulatory Visit: Payer: Self-pay

## 2020-11-01 ENCOUNTER — Ambulatory Visit (HOSPITAL_COMMUNITY)
Admission: EM | Admit: 2020-11-01 | Discharge: 2020-11-01 | Disposition: A | Discharge status: Home / Self Care | Payer: Self-pay | Attending: Emergency Medicine | Admitting: Emergency Medicine

## 2020-11-01 ENCOUNTER — Ambulatory Visit (INDEPENDENT_AMBULATORY_CARE_PROVIDER_SITE_OTHER): Payer: Self-pay

## 2020-11-01 ENCOUNTER — Encounter (HOSPITAL_COMMUNITY): Payer: Self-pay | Admitting: Emergency Medicine

## 2020-11-01 DIAGNOSIS — R079 Chest pain, unspecified: Secondary | ICD-10-CM

## 2020-11-01 DIAGNOSIS — R509 Fever, unspecified: Secondary | ICD-10-CM

## 2020-11-01 DIAGNOSIS — R059 Cough, unspecified: Secondary | ICD-10-CM

## 2020-11-01 DIAGNOSIS — N3000 Acute cystitis without hematuria: Secondary | ICD-10-CM

## 2020-11-01 LAB — POCT URINALYSIS DIPSTICK, ED / UC
Bilirubin Urine: NEGATIVE
Glucose, UA: NEGATIVE mg/dL
Ketones, ur: NEGATIVE mg/dL
Nitrite: POSITIVE — AB
Protein, ur: 30 mg/dL — AB
Specific Gravity, Urine: 1.025 (ref 1.005–1.030)
Urobilinogen, UA: 0.2 mg/dL (ref 0.0–1.0)
pH: 6 (ref 5.0–8.0)

## 2020-11-01 MED ORDER — CEPHALEXIN 500 MG PO CAPS
500.0000 mg | ORAL_CAPSULE | Freq: Four times a day (QID) | ORAL | 0 refills | Status: DC
Start: 2020-11-01 — End: 2021-10-19

## 2020-11-01 NOTE — ED Provider Notes (Signed)
[place]    CSN: [id number] Arrival date & time: 11/01/20  0865      History   Chief Complaint Chief Complaint  Patient presents with   Fever   Cough    HPI Linda Decker is a 50 y.o. female. Pt reports chills and chest pain last week on 10/27/20. Chest pain felt like pressure and lasted 3-4 hours.  Pt was driving home from store when central chest pressure occurred. Pt continued about her day without stopping and eventually chest pain went away on its own after 3-4 hours. Chills have been present since 10/27/20. Denies other symptoms associated with chest pain except chills. Pt reports a second, shorter, similar episode of chest pain on 10/29/20.  Pt reports she developed a cough 10/29/20 and feels like she has had a fever. Has been using nyquil and dayquil for minimal relief of symptoms. Denies nasal congestion or sore throat. Reports had a UTI about a month ago that was treated with antibiotics and pt feels like UTI resolved but now feels like she has another UTI, complains of burning with urination.   This visit was conducted with the help of a Spanish video interpreter.    Fever Associated symptoms: chest pain, chills, cough and dysuria   Associated symptoms: no congestion, no nausea, no rhinorrhea, no sore throat and no vomiting   Cough Associated symptoms: chest pain, chills and fever   Associated symptoms: no diaphoresis, no rhinorrhea and no sore throat    History reviewed. No pertinent past medical history.  Patient Active Problem List   Diagnosis Date Noted   Healthcare maintenance 11/07/2019   Pyelonephritis 10/24/2019    History reviewed. No pertinent surgical history.  OB History     Gravida  1   Para  0   Term  0   Preterm  0   AB  0   Living         SAB  0   IAB  0   Ectopic  0   Multiple      Live Births               Home Medications    Prior to Admission medications   Medication Sig Start Date End Date  Taking? Authorizing Provider  cephALEXin (KEFLEX) 500 MG capsule Take 1 capsule (500 mg total) by mouth 4 (four) times daily. 11/01/20  Yes Carvel Getting, NP  Multiple Vitamin (MULTIVITAMIN) tablet Take 1 tablet by mouth daily.    [provider]  nitrofurantoin, macrocrystal-monohydrate, (MACROBID) 100 MG capsule Take 1 capsule (100 mg total) by mouth 2 (two) times daily. 09/25/20   Pearson Forster, NP    Family History No family history on file.  Social History Social History   Tobacco Use   Smoking status: Never   Smokeless tobacco: Never  Substance Use Topics   Alcohol use: Never   Drug use: Never     Allergies   Patient has no known allergies.   Review of Systems Review of Systems  Constitutional:  Positive for chills and fever. Negative for diaphoresis.  HENT:  Negative for congestion, rhinorrhea and sore throat.   Respiratory:  Positive for cough.   Cardiovascular:  Positive for chest pain. Negative for leg swelling.  Gastrointestinal:  Negative for abdominal pain, nausea and vomiting.  Genitourinary:  Positive for dysuria. Negative for flank pain.    Physical Exam Triage Vital Signs ED Triage Vitals  Enc Vitals Group  BP 11/01/20 1147 (!) 122/56     Pulse Rate 11/01/20 1147 81     Resp 11/01/20 1147 17     Temp 11/01/20 1156 99.8 F (37.7 C)     Temp Source 11/01/20 1156 Oral     SpO2 11/01/20 1147 97 %     Weight --      Height --      Head Circumference --      Peak Flow --      Pain Score 11/01/20 1154 0     Pain Loc --      Pain Edu? --      Excl. in [place]? --    No data found.  Updated Vital Signs BP (!) 122/56 (BP Location: Left Arm)   Pulse 81   Temp 99.8 F (37.7 C) (Oral)   Resp 17   SpO2 97%   Visual Acuity Right Eye Distance:   Left Eye Distance:   Bilateral Distance:    Right Eye Near:   Left Eye Near:    Bilateral Near:     Physical Exam Constitutional:      General: She is not in acute distress.     Appearance: Normal appearance. She is obese. She is not ill-appearing.  HENT:     Right Ear: Tympanic membrane, ear canal and external ear normal.     Left Ear: Tympanic membrane, ear canal and external ear normal.     Nose: No congestion or rhinorrhea.     Mouth/Throat:     Mouth: Mucous membranes are moist.     Pharynx: Oropharynx is clear.  Cardiovascular:     Rate and Rhythm: Normal rate and regular rhythm.     Comments: No peripheral edema Pulmonary:     Effort: Pulmonary effort is normal.     Breath sounds: Normal breath sounds.  Chest:     Chest wall: No tenderness.  Lymphadenopathy:     Cervical: No cervical adenopathy.  Neurological:     Mental Status: She is alert.     UC Treatments / Results  Labs (all labs ordered are listed, but only abnormal results are displayed) Labs Reviewed  URINE CULTURE - Abnormal; Notable for the following components:      Result Value   Culture >=100,000 COLONIES/mL ESCHERICHIA COLI (*)    Organism ID, Bacteria ESCHERICHIA COLI (*)    All other components within normal limits  POCT URINALYSIS DIPSTICK, ED / UC - Abnormal; Notable for the following components:   Hgb urine dipstick LARGE (*)    Protein, ur 30 (*)    Nitrite POSITIVE (*)    Leukocytes,Ua SMALL (*)    All other components within normal limits    EKG  EKG: normal EKG, normal sinus rhythm.     Radiology No results found.  Procedures Procedures (including critical care time)  Medications Ordered in UC Medications - No data to display  Initial Impression / Assessment and Plan / UC Course  I have reviewed the triage vital signs and the nursing notes.  Pertinent labs & imaging results that were available during my care of the patient were reviewed by me and considered in my medical decision making (see chart for details).    CXR shows cardiomegaly for which I do not have a cause. No peripheral edema, no dyspnea today at urgent care. Discussed cardiomegaly with  pt and encouraged her to seek care with a PCP for further evaluation. Discussed if pt has episodes of CP  again, she is to seek help in ED.   Will treat again for UTI. This may be cause of chills, fever that pt reported.   Final Clinical Impressions(s) / UC Diagnoses   Final diagnoses:  Acute cystitis without hematuria  Chest pain, unspecified type     Discharge Instructions      Please follow up with a primary care provider about your chest pain. If you have the chest pain again, please go to the emergency room to have it checked.      ED Prescriptions     Medication Sig Dispense Auth. Provider   cephALEXin (KEFLEX) 500 MG capsule Take 1 capsule (500 mg total) by mouth 4 (four) times daily. 20 capsule Carvel Getting, NP      PDMP not reviewed this encounter.   Carvel Getting, NP 11/03/20 1410

## 2020-11-01 NOTE — Discharge Instructions (Signed)
Please follow up with a primary care provider about your chest pain. If you have the chest pain again, please go to the emergency room to have it checked.

## 2020-11-01 NOTE — ED Triage Notes (Addendum)
Used Engineer, manufacturing systems.  Symptoms started last Wed. Had chills, pain in chest, fever but then went away on Friday but fever started back over the weekend.  Pt also states that she had dysuria.  Pain in chest was in central area that was on Wed and Thursday then none since.  Has cough is dry. Also has headache.  Pt reports taking dayquil and nyquil for symptoms.

## 2020-11-02 ENCOUNTER — Telehealth (HOSPITAL_COMMUNITY): Payer: Self-pay

## 2020-11-02 NOTE — Telephone Encounter (Signed)
Pt called requesting for Keflex 500 mg to be reduced. Stated she feels dizzy when taking the pills. Spoke to Medical Provider who advised eating with every pill taken. Pt expressed understanding.

## 2020-11-03 LAB — URINE CULTURE: Culture: >=100000 — AB

## 2021-10-19 ENCOUNTER — Ambulatory Visit (HOSPITAL_COMMUNITY)
Admission: EM | Admit: 2021-10-19 | Discharge: 2021-10-19 | Disposition: A | Discharge status: Home / Self Care | Payer: Self-pay | Attending: Family Medicine | Admitting: Family Medicine

## 2021-10-19 ENCOUNTER — Encounter (HOSPITAL_COMMUNITY): Payer: Self-pay | Admitting: Emergency Medicine

## 2021-10-19 DIAGNOSIS — N3090 Cystitis, unspecified without hematuria: Secondary | ICD-10-CM

## 2021-10-19 LAB — POCT URINALYSIS DIPSTICK, ED / UC
Bilirubin Urine: NEGATIVE
Glucose, UA: NEGATIVE mg/dL
Ketones, ur: NEGATIVE mg/dL
Nitrite: NEGATIVE
Protein, ur: NEGATIVE mg/dL
Specific Gravity, Urine: <=1.005 (ref 1.005–1.030)
Urobilinogen, UA: 0.2 mg/dL (ref 0.0–1.0)
pH: 7 (ref 5.0–8.0)

## 2021-10-19 MED ORDER — CIPROFLOXACIN HCL 500 MG PO TABS: 500.0000 mg | ORAL_TABLET | Freq: Two times a day (BID) | ORAL | 0 refills | Status: AC

## 2021-10-19 NOTE — Discharge Instructions (Addendum)
Urinalysis had some white blood cells on it, a sign of a bladder infection  Take Cipro 500 mg--1 tablet 2 times daily for 5 days ( [place] 1 tableta por la boca 2 veces al dia por 5 dias)   Staff will notify you of any positives on the swab  (le vamos hablar si hay pruebas positivas en le swab vaginal)

## 2021-10-19 NOTE — ED Provider Notes (Signed)
[place]    CSN: [id number] Arrival date & time: 10/19/21  1846      History   Chief Complaint Chief Complaint  Patient presents with   Dysuria    HPI Linda Decker is a 51 y.o. female.    Dysuria  Here for urinary frequency some and discomfort when urinating.  Symptoms began October 8.  No fever and no vomiting.  No back pain.  She has been taking some AZO and it is helping some.  She began having a little itching today  No allergies to medicines  It has been a little less than a year since she had a period  History reviewed. No pertinent past medical history.  Patient Active Problem List   Diagnosis Date Noted   Healthcare maintenance 11/07/2019   Pyelonephritis 10/24/2019    History reviewed. No pertinent surgical history.  OB History     Gravida  1   Para  0   Term  0   Preterm  0   AB  0   Living         SAB  0   IAB  0   Ectopic  0   Multiple      Live Births               Home Medications    Prior to Admission medications   Medication Sig Start Date End Date Taking? Authorizing Provider  ciprofloxacin (CIPRO) 500 MG tablet Take 1 tablet (500 mg total) by mouth 2 (two) times daily for 5 days. 10/19/21 10/24/21 Yes Barrett Henle, MD  Multiple Vitamin (MULTIVITAMIN) tablet Take 1 tablet by mouth daily.    [provider]    Family History History reviewed. No pertinent family history.  Social History Social History   Tobacco Use   Smoking status: Never   Smokeless tobacco: Never  Substance Use Topics   Alcohol use: Never   Drug use: Never     Allergies   Patient has no known allergies.   Review of Systems Review of Systems  Genitourinary:  Positive for dysuria.     Physical Exam Triage Vital Signs ED Triage Vitals  Enc Vitals Group     BP 10/19/21 1932 129/73     Pulse Rate 10/19/21 1932 60     Resp 10/19/21 1932 18     Temp 10/19/21 1932 98.3 F (36.8 C)     Temp  Source 10/19/21 1932 Oral     SpO2 10/19/21 1932 97 %     Weight --      Height --      Head Circumference --      Peak Flow --      Pain Score 10/19/21 1931 0     Pain Loc --      Pain Edu? --      Excl. in [place]? --    No data found.  Updated Vital Signs BP 129/73 (BP Location: Right Arm)   Pulse 60   Temp 98.3 F (36.8 C) (Oral)   Resp 18   SpO2 97%   Visual Acuity Right Eye Distance:   Left Eye Distance:   Bilateral Distance:    Right Eye Near:   Left Eye Near:    Bilateral Near:     Physical Exam Vitals reviewed.  Constitutional:      General: She is not in acute distress.    Appearance: She is not ill-appearing, toxic-appearing or diaphoretic.  HENT:     Mouth/Throat:     Mouth: Mucous membranes are moist.  Cardiovascular:     Rate and Rhythm: Normal rate and regular rhythm.     Heart sounds: No murmur heard. Pulmonary:     Effort: Pulmonary effort is normal.     Breath sounds: Normal breath sounds. No stridor. No wheezing, rhonchi or rales.  Abdominal:     Palpations: Abdomen is soft.     Tenderness: There is no abdominal tenderness. There is no right CVA tenderness or left CVA tenderness.  Skin:    Coloration: Skin is not jaundiced or pale.  Neurological:     Mental Status: She is alert and oriented to person, place, and time.  Psychiatric:        Behavior: Behavior normal.      UC Treatments / Results  Labs (all labs ordered are listed, but only abnormal results are displayed) Labs Reviewed  POCT URINALYSIS DIPSTICK, ED / UC - Abnormal; Notable for the following components:      Result Value   Hgb urine dipstick TRACE (*)    Leukocytes,Ua SMALL (*)    All other components within normal limits  CERVICOVAGINAL ANCILLARY ONLY    EKG   Radiology No results found.  Procedures Procedures (including critical care time)  Medications Ordered in UC Medications - No data to display  Initial Impression / Assessment and Plan / UC Course  I  have reviewed the triage vital signs and the nursing notes.  Pertinent labs & imaging results that were available during my care of the patient were reviewed by me and considered in my medical decision making (see chart for details).       Visit is conducted in Spanish  UA shows a small amount of leukocytes and a trace of hemoglobin.  In the past every time she has had a urine culture it has been positive for E. coli that is pansensitive.  She does not have medical coverage, therefore I am not going to do a culture today I am going to treat her with Cipro  We will notify her if her swab is positive for anything Final Clinical Impressions(s) / UC Diagnoses   Final diagnoses:  Cystitis     Discharge Instructions      Urinalysis had some white blood cells on it, a sign of a bladder infection  Take Cipro 500 mg--1 tablet 2 times daily for 5 days ( [place] 1 tableta por la boca 2 veces al dia por 5 dias)   Staff will notify you of any positives on the swab  (le vamos hablar si hay pruebas positivas en le swab vaginal)     ED Prescriptions     Medication Sig Dispense Auth. Provider   ciprofloxacin (CIPRO) 500 MG tablet Take 1 tablet (500 mg total) by mouth 2 (two) times daily for 5 days. 10 tablet Marlinda Mike Janace Aris, MD      PDMP not reviewed this encounter.   Zenia Resides, MD 10/19/21 2003

## 2021-10-19 NOTE — ED Triage Notes (Signed)
Pt reports discomfort when urinating and vaginal itching. Symptoms began on Sunday. Has tried AZO for symptom relief.

## 2021-10-20 LAB — CERVICOVAGINAL ANCILLARY ONLY
Bacterial Vaginitis (gardnerella): NEGATIVE
Candida Glabrata: NEGATIVE
Candida Vaginitis: NEGATIVE
Chlamydia: NEGATIVE
Comment: NEGATIVE
Comment: NEGATIVE
Comment: NEGATIVE
Comment: NEGATIVE
Comment: NEGATIVE
Comment: NORMAL
Neisseria Gonorrhea: NEGATIVE
Trichomonas: NEGATIVE

## 2021-12-22 ENCOUNTER — Encounter (HOSPITAL_COMMUNITY): Payer: Self-pay

## 2021-12-22 ENCOUNTER — Ambulatory Visit (HOSPITAL_COMMUNITY)
Admission: EM | Admit: 2021-12-22 | Discharge: 2021-12-22 | Disposition: A | Discharge status: Home / Self Care | Payer: Self-pay | Attending: Family Medicine | Admitting: Family Medicine

## 2021-12-22 DIAGNOSIS — K2900 Acute gastritis without bleeding: Secondary | ICD-10-CM

## 2021-12-22 MED ORDER — FAMOTIDINE 20 MG PO TABS: 20.0000 mg | ORAL_TABLET | Freq: Two times a day (BID) | ORAL | 0 refills | Status: AC

## 2021-12-22 MED ORDER — DICYCLOMINE HCL 20 MG PO TABS: 20.0000 mg | ORAL_TABLET | Freq: Four times a day (QID) | ORAL | 0 refills | Status: AC | PRN

## 2021-12-22 NOTE — ED Provider Notes (Addendum)
[facility]    CSN: [id number] Arrival date & time: 12/22/21  0944      History   Chief Complaint Chief Complaint  Patient presents with   Abdominal Pain    HPI Linda Decker is a 51 y.o. female.    Abdominal Pain  Here for upper abdominal pain.  Symptoms began 2 days ago.  She notes that she drank a smoothie with oranges and other acidic foods.  After drinking that that morning, she began having pain in her epigastric area.  She did have nausea but no vomiting or diarrhea.  No fever or chills.  No cough or congestion.  Last BM was this morning and no blood in it  No dysuria or urinary frequency.  The pain is mainly been in the epigastric area and at first has been sort of an ache and burning.  It has improved some but is still coming back pain and spasms  History reviewed. No pertinent past medical history.  Patient Active Problem List   Diagnosis Date Noted   Healthcare maintenance 11/07/2019   Pyelonephritis 10/24/2019    History reviewed. No pertinent surgical history.  OB History     Gravida  1   Para  0   Term  0   Preterm  0   AB  0   Living         SAB  0   IAB  0   Ectopic  0   Multiple      Live Births               Home Medications    Prior to Admission medications   Medication Sig Start Date End Date Taking? Authorizing Provider  dicyclomine (BENTYL) 20 MG tablet Take 1 tablet (20 mg total) by mouth 4 (four) times daily as needed (intestinal cramps). 12/22/21  Yes Zenia Resides, MD  famotidine (PEPCID) 20 MG tablet Take 1 tablet (20 mg total) by mouth 2 (two) times daily. 12/22/21  Yes Zenia Resides, MD  Multiple Vitamin (MULTIVITAMIN) tablet Take 1 tablet by mouth daily.    [provider]    Family History History reviewed. No pertinent family history.  Social History Social History   Tobacco Use   Smoking status: Never   Smokeless tobacco: Never  Substance Use Topics   Alcohol  use: Never   Drug use: Never     Allergies   Patient has no known allergies.   Review of Systems Review of Systems  Gastrointestinal:  Positive for abdominal pain.     Physical Exam Triage Vital Signs ED Triage Vitals [12/22/21 1053]  Enc Vitals Group     BP 124/71     Pulse Rate 61     Resp 18     Temp (!) 97.5 F (36.4 C)     Temp Source Oral     SpO2 98 %     Weight      Height      Head Circumference      Peak Flow      Pain Score      Pain Loc      Pain Edu?      Excl. in [place]?    No data found.  Updated Vital Signs BP 124/71 (BP Location: Left Arm)   Pulse 61   Temp (!) 97.5 F (36.4 C) (Oral)   Resp 18   SpO2 98%   Visual Acuity Right Eye Distance:  Left Eye Distance:   Bilateral Distance:    Right Eye Near:   Left Eye Near:    Bilateral Near:     Physical Exam Vitals reviewed.  Constitutional:      General: She is not in acute distress.    Appearance: She is not toxic-appearing.  HENT:     Right Ear: Ear canal normal.     Nose: Nose normal.     Mouth/Throat:     Mouth: Mucous membranes are moist.     Pharynx: No oropharyngeal exudate or posterior oropharyngeal erythema.  Eyes:     Extraocular Movements: Extraocular movements intact.     Conjunctiva/sclera: Conjunctivae normal.     Pupils: Pupils are equal, round, and reactive to light.  Cardiovascular:     Rate and Rhythm: Normal rate and regular rhythm.     Heart sounds: No murmur heard. Pulmonary:     Effort: Pulmonary effort is normal. No respiratory distress.     Breath sounds: No stridor. No wheezing, rhonchi or rales.  Abdominal:     General: There is no distension.     Palpations: Abdomen is soft. There is no mass.     Tenderness: There is abdominal tenderness (periumbilical). There is no guarding.  Musculoskeletal:     Cervical back: Neck supple.  Lymphadenopathy:     Cervical: No cervical adenopathy.  Skin:    Capillary Refill: Capillary refill takes less than 2  seconds.     Coloration: Skin is not jaundiced or pale.  Neurological:     General: No focal deficit present.     Mental Status: She is alert and oriented to person, place, and time.  Psychiatric:        Behavior: Behavior normal.      UC Treatments / Results  Labs (all labs ordered are listed, but only abnormal results are displayed) Labs Reviewed - No data to display  EKG   Radiology No results found.  Procedures Procedures (including critical care time)  Medications Ordered in UC Medications - No data to display  Initial Impression / Assessment and Plan / UC Course  I have reviewed the triage vital signs and the nursing notes.  Pertinent labs & imaging results that were available during my care of the patient were reviewed by me and considered in my medical decision making (see chart for details).    Visit conducted in [place] I do feel this is most likely a postviral gastritis.  Will treat with Pepcid and Bentyl.  She is given information on how to schedule a new patient appointment and assistance is requested to help her find a PCP Final Clinical Impressions(s) / UC Diagnoses   Final diagnoses:  Acute gastritis without hemorrhage, unspecified gastritis type     Discharge Instructions      Dicyclomine--take 1 every 6 hours as needed for intestinal cramps( tome por la boca cada 6 horas cuando tiene dolor del intestino)   Take famotidine 20 mg--1 tablet 2 times daily.  This is for stomach acid and acid reflux  (1 por la boca 2 veces al dia. Esta medicina baja la cantidad de acido que el estomago have)  You can use the QR code/website at the back of the summary paperwork to schedule yourself a new patient appointment with primary care (puede hacer una cita con medicine general si [place] el sitio del web en este resumen)   You have been seen at the Internal medicine center in 2021; you would be considered  an established patient there (Ud ha sido vista en el centro [facility] en 2021 -despues de que Ud tuvo infeccion del rinon)       ED Prescriptions     Medication Sig Dispense Auth. Provider   famotidine (PEPCID) 20 MG tablet Take 1 tablet (20 mg total) by mouth 2 (two) times daily. 30 tablet Alka Falwell, Gwenlyn Perking, MD   dicyclomine (BENTYL) 20 MG tablet Take 1 tablet (20 mg total) by mouth 4 (four) times daily as needed (intestinal cramps). 40 tablet Arleth Mccullar, Gwenlyn Perking, MD      PDMP not reviewed this encounter.   Barrett Henle, MD 12/22/21 1114    Barrett Henle, MD 12/22/21 1228

## 2021-12-22 NOTE — Discharge Instructions (Addendum)
Dicyclomine--take 1 every 6 hours as needed for intestinal cramps( tome por la boca cada 6 horas cuando tiene dolor del intestino)   Take famotidine 20 mg--1 tablet 2 times daily.  This is for stomach acid and acid reflux  (1 por la boca 2 veces al dia. Esta medicina baja la cantidad de acido que el estomago have)  You can use the QR code/website at the back of the summary paperwork to schedule yourself a new patient appointment with primary care (puede hacer una cita con medicine general si [place] el sitio del web en este resumen)   You have been seen at the Internal medicine center in 2021; you would be considered an established patient there (Ud ha sido vista en el centro [facility] inter[facility] en 2021 -despues de que Ud tuvo infeccion del rinon)

## 2021-12-22 NOTE — ED Triage Notes (Signed)
Pt presents to the office for abdominal pain x 2 days. ;Last bowel movement was this morning.

## 2022-02-03 ENCOUNTER — Ambulatory Visit: Payer: Self-pay | Admitting: Family Medicine

## 2022-02-03 ENCOUNTER — Telehealth: Payer: Self-pay | Admitting: Family Medicine

## 2022-02-03 NOTE — Telephone Encounter (Signed)
1.26.24 no show letter sent 

## 2022-02-03 NOTE — Telephone Encounter (Signed)
Dismissed due to missing new pt visit

## 2023-09-04 IMAGING — DX DG CHEST 2V
2 series · 2 of 2 positions shown · non-contrast
Comparison: 07/03/2019

CLINICAL DATA: Chest pain, fever, cough

EXAM:
CHEST - 2 VIEW

[chest pa]
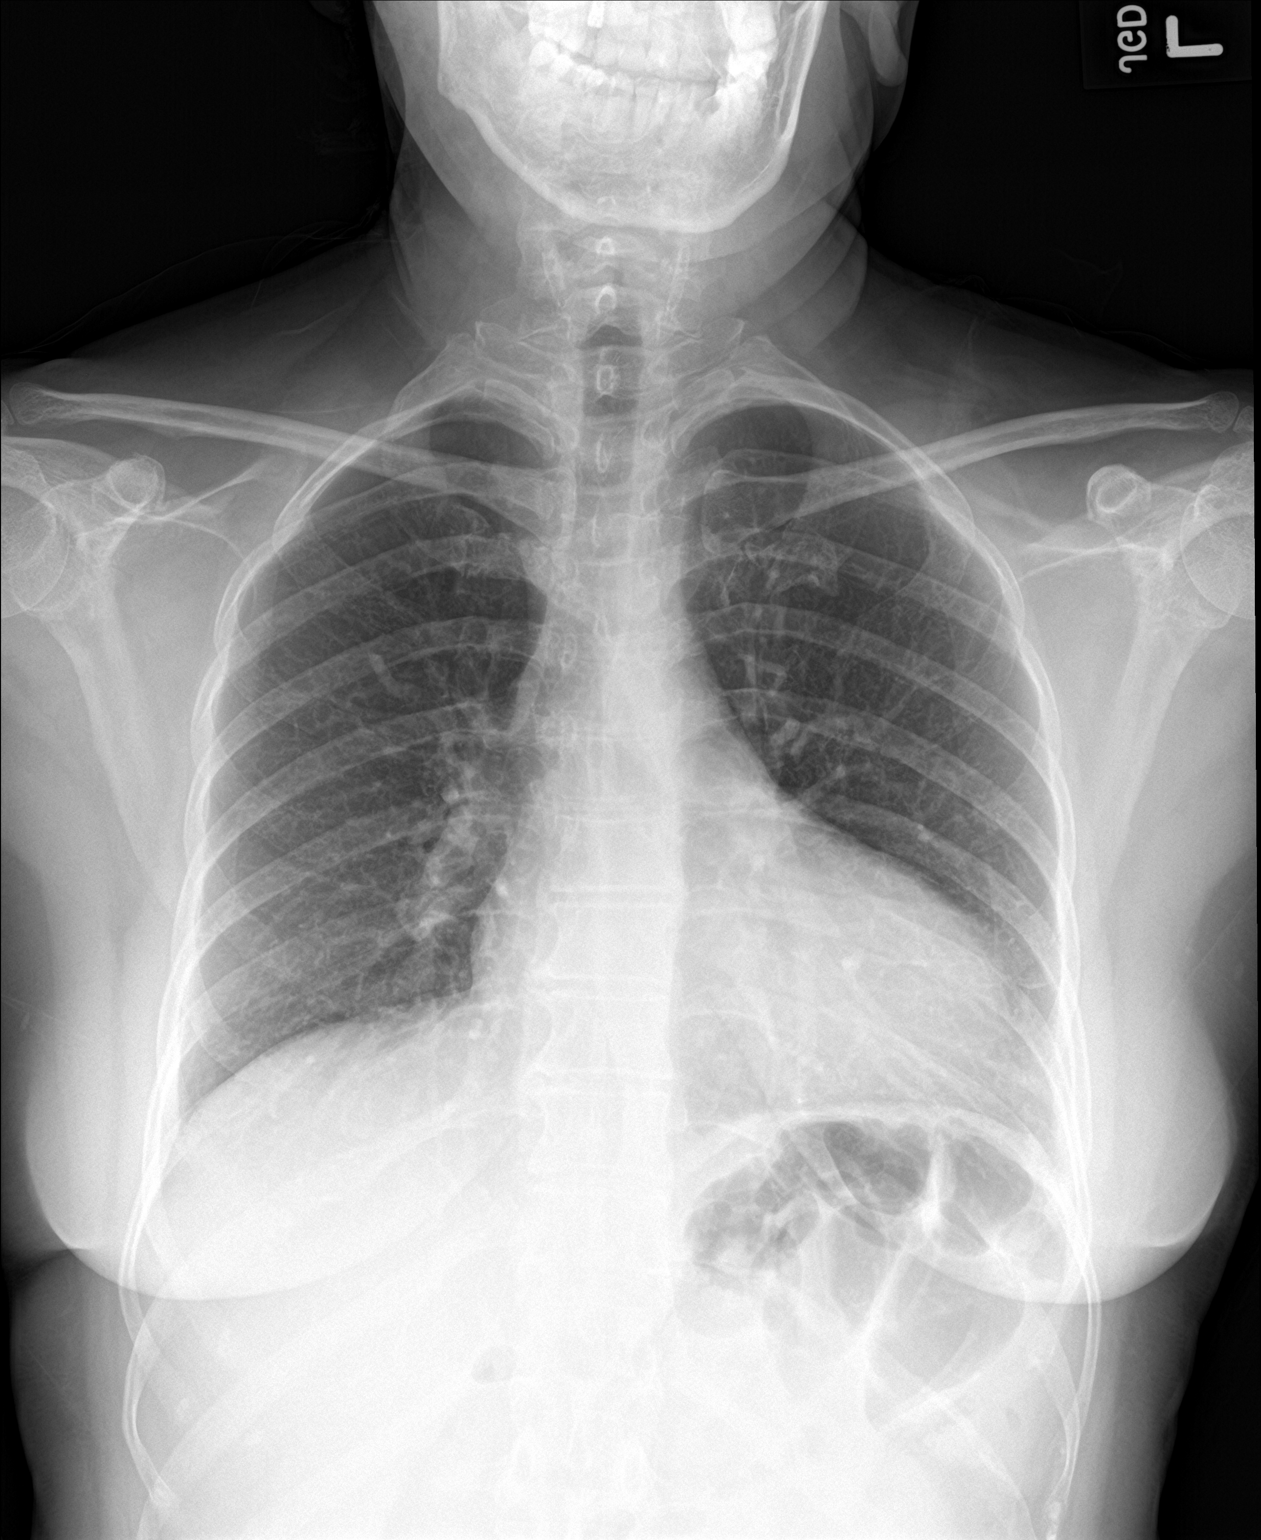

[chest lat]
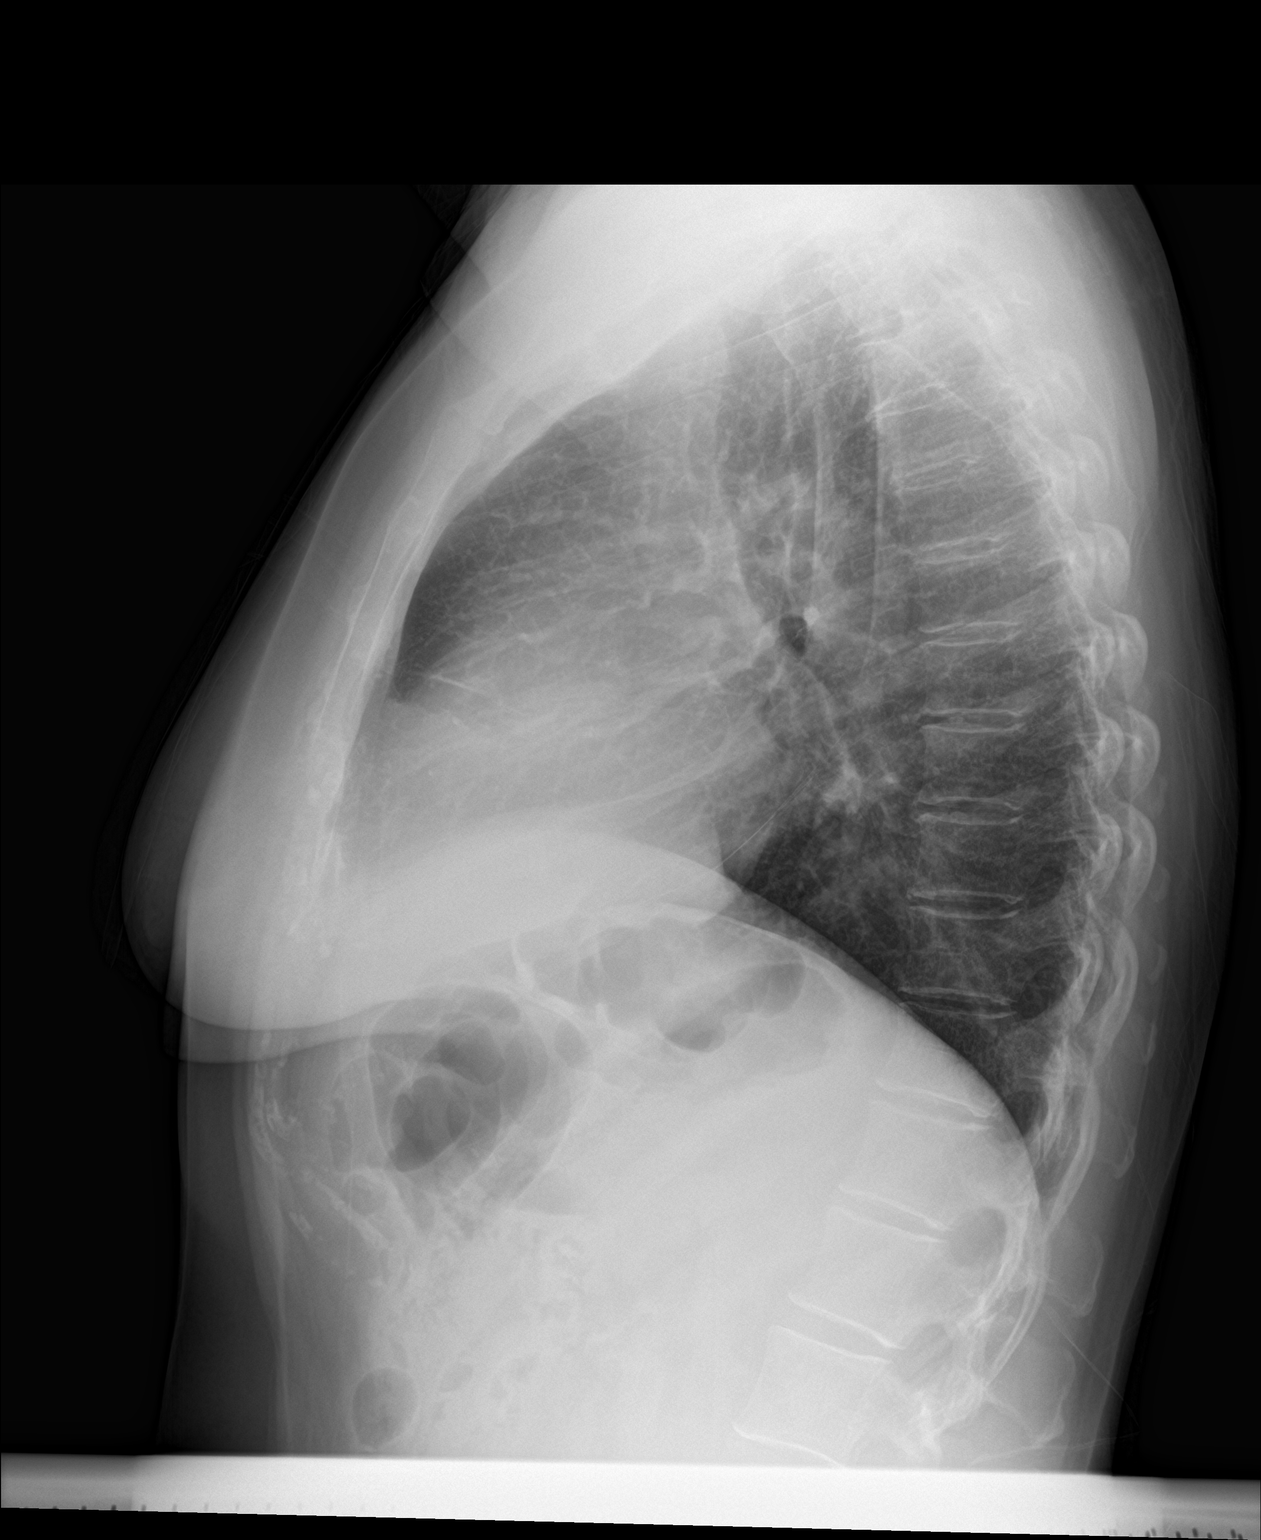

[2 of 2 positions shown; findings below may reference images not displayed]

FINDINGS: Cardiomegaly. Both lungs are clear. The visualized skeletal
structures are unremarkable.
IMPRESSION: Cardiomegaly without acute abnormality of the lungs.
